# Patient Record
Sex: Female | Born: 1970 | Race: White | Hispanic: No | Marital: Married | Smoking: Never smoker
Health system: Southern US, Community
[De-identification: ages and names within clinical notes are randomized; demographics above are authoritative.]

## PROBLEM LIST (undated history)

## (undated) DIAGNOSIS — M419 Scoliosis, unspecified: Secondary | ICD-10-CM

## (undated) DIAGNOSIS — G909 Disorder of the autonomic nervous system, unspecified: Secondary | ICD-10-CM

## (undated) HISTORY — PX: BREAST LUMPECTOMY: SHX2

## (undated) HISTORY — DX: Scoliosis, unspecified: M41.9

## (undated) HISTORY — PX: ABDOMINAL HYSTERECTOMY: SHX81

## (undated) HISTORY — DX: Disorder of the autonomic nervous system, unspecified: G90.9

## (undated) HISTORY — PX: SPINAL FUSION: SHX223

## (undated) HISTORY — PX: TONSILLECTOMY: SHX5217

## (undated) HISTORY — PX: HERNIA REPAIR: SHX51

---

## 2001-05-03 ENCOUNTER — Encounter: Payer: Self-pay | Admitting: Family Medicine

## 2001-05-03 ENCOUNTER — Encounter: Admission: RE | Admit: 2001-05-03 | Discharge: 2001-05-03 | Payer: Self-pay | Admitting: Family Medicine

## 2001-06-05 ENCOUNTER — Ambulatory Visit (HOSPITAL_COMMUNITY): Admission: RE | Admit: 2001-06-05 | Discharge: 2001-06-05 | Payer: Self-pay | Admitting: Surgery

## 2001-06-05 ENCOUNTER — Encounter (INDEPENDENT_AMBULATORY_CARE_PROVIDER_SITE_OTHER): Payer: Self-pay | Admitting: *Deleted

## 2001-07-15 ENCOUNTER — Encounter: Admission: RE | Admit: 2001-07-15 | Discharge: 2001-07-15 | Payer: Self-pay | Admitting: Family Medicine

## 2001-07-15 ENCOUNTER — Encounter: Payer: Self-pay | Admitting: Family Medicine

## 2005-10-10 ENCOUNTER — Encounter: Admission: RE | Admit: 2005-10-10 | Discharge: 2005-10-10 | Payer: Self-pay | Admitting: Family Medicine

## 2006-04-17 ENCOUNTER — Other Ambulatory Visit: Admission: RE | Admit: 2006-04-17 | Discharge: 2006-04-17 | Payer: Self-pay | Admitting: Family Medicine

## 2008-10-01 ENCOUNTER — Other Ambulatory Visit: Admission: RE | Admit: 2008-10-01 | Discharge: 2008-10-01 | Payer: Self-pay | Admitting: Gynecology

## 2008-10-01 ENCOUNTER — Ambulatory Visit: Payer: Self-pay | Admitting: Gynecology

## 2008-10-01 ENCOUNTER — Encounter: Payer: Self-pay | Admitting: Gynecology

## 2008-10-07 ENCOUNTER — Ambulatory Visit: Payer: Self-pay | Admitting: Gynecology

## 2008-12-02 ENCOUNTER — Ambulatory Visit: Payer: Self-pay | Admitting: Gynecology

## 2008-12-10 ENCOUNTER — Ambulatory Visit: Payer: Self-pay | Admitting: Gynecology

## 2008-12-10 ENCOUNTER — Ambulatory Visit (HOSPITAL_BASED_OUTPATIENT_CLINIC_OR_DEPARTMENT_OTHER): Admission: RE | Admit: 2008-12-10 | Discharge: 2008-12-11 | Payer: Self-pay | Admitting: Gynecology

## 2008-12-10 ENCOUNTER — Encounter: Payer: Self-pay | Admitting: Gynecology

## 2008-12-21 ENCOUNTER — Ambulatory Visit: Payer: Self-pay | Admitting: Gynecology

## 2009-01-06 ENCOUNTER — Ambulatory Visit: Payer: Self-pay | Admitting: Gynecology

## 2010-12-30 ENCOUNTER — Other Ambulatory Visit (HOSPITAL_COMMUNITY)
Admission: RE | Admit: 2010-12-30 | Discharge: 2010-12-30 | Disposition: A | Discharge status: Home / Self Care | Payer: BC Managed Care – PPO | Source: Ambulatory Visit | Attending: Gynecology | Admitting: Gynecology

## 2010-12-30 ENCOUNTER — Other Ambulatory Visit: Payer: Self-pay | Admitting: Gynecology

## 2010-12-30 ENCOUNTER — Encounter (INDEPENDENT_AMBULATORY_CARE_PROVIDER_SITE_OTHER): Payer: BC Managed Care – PPO | Admitting: Gynecology

## 2010-12-30 DIAGNOSIS — Z833 Family history of diabetes mellitus: Secondary | ICD-10-CM

## 2010-12-30 DIAGNOSIS — N951 Menopausal and female climacteric states: Secondary | ICD-10-CM

## 2010-12-30 DIAGNOSIS — R823 Hemoglobinuria: Secondary | ICD-10-CM

## 2010-12-30 DIAGNOSIS — Z124 Encounter for screening for malignant neoplasm of cervix: Secondary | ICD-10-CM | POA: Insufficient documentation

## 2010-12-30 DIAGNOSIS — Z13220 Encounter for screening for lipoid disorders: Secondary | ICD-10-CM

## 2010-12-30 DIAGNOSIS — Z01419 Encounter for gynecological examination (general) (routine) without abnormal findings: Secondary | ICD-10-CM

## 2011-03-06 LAB — DIFFERENTIAL
Basophils Absolute: 0 10*3/uL (ref 0.0–0.1)
Basophils Relative: 0 % (ref 0–1)
Eosinophils Absolute: 0 10*3/uL (ref 0.0–0.7)
Eosinophils Relative: 0 % (ref 0–5)
Lymphocytes Relative: 13 % (ref 12–46)
Lymphs Abs: 1.5 10*3/uL (ref 0.7–4.0)
Monocytes Absolute: 0.8 10*3/uL (ref 0.1–1.0)
Monocytes Relative: 7 % (ref 3–12)
Neutro Abs: 9.3 10*3/uL — ABNORMAL HIGH (ref 1.7–7.7)
Neutrophils Relative %: 80 % — ABNORMAL HIGH (ref 43–77)

## 2011-03-06 LAB — CBC
HCT: 32.6 % — ABNORMAL LOW (ref 36.0–46.0)
Hemoglobin: 11.2 g/dL — ABNORMAL LOW (ref 12.0–15.0)
MCHC: 34.3 g/dL (ref 30.0–36.0)
MCV: 97.4 fL (ref 78.0–100.0)
Platelets: 190 10*3/uL (ref 150–400)
RBC: 3.35 MIL/uL — ABNORMAL LOW (ref 3.87–5.11)
RDW: 12.2 % (ref 11.5–15.5)
WBC: 11.6 10*3/uL — ABNORMAL HIGH (ref 4.0–10.5)

## 2011-04-04 NOTE — Discharge Summary (Signed)
[name], [name]          ACCOUNT NO.:  [id number]   MEDICAL RECORD NO.:  [id number]          PATIENT TYPE:  AMB   LOCATION:  [facility]                         FACILITY:  [facility]   PHYSICIAN:  Timothy P. Fontaine, M.D.DATE OF BIRTH:  1971/04/09   DATE OF ADMISSION:  12/10/2008  DATE OF DISCHARGE:  12/11/2008                               DISCHARGE SUMMARY   DISCHARGE DIAGNOSES:  1. Menorrhagia.  2. Dysmenorrhea.  3. Endometriosis.   PROCEDURE:  Laparoscopic-assisted vaginal hysterectomy, fulguration  endometriosis on December 10, 2008.  Surgeon, Fontaine.  Pathology  pending.   HOSPITAL COURSE:  A 40 year old underwent uncomplicated LAVH with  findings of posterior cul-de-sac endometriosis.  All visible implants  were fulgurated.  The patient's postoperative course was uncomplicated.  She was discharged on postoperative day #1, ambulating well, tolerating  a regular diet, voiding without difficulty with a postoperative  hemoglobin of 11.  The patient received precautions, instructions, and  followup, will be seen in the office in 2 weeks, and received a  prescription for Tylox #25 one to two p.o. every 4 to 6 hours p.r.n.  pain.      Timothy P. Fontaine, M.D.  Electronically Signed     TPF/MEDQ  D:  12/11/2008  T:  12/11/2008  Job:  [id number]

## 2011-04-04 NOTE — H&P (Signed)
[name], [name]          ACCOUNT NO.:  [id number]   MEDICAL RECORD NO.:  [id number]          PATIENT TYPE:  AMB   LOCATION:  [facility]                         FACILITY:  [facility]   PHYSICIAN:  Timothy P. Fontaine, M.D.DATE OF BIRTH:  Sep 03, 1971   DATE OF ADMISSION:  12/10/2008  DATE OF DISCHARGE:                              HISTORY & PHYSICAL   CHIEF COMPLAINT:  Increasing dysmenorrhea and menorrhagia.   HISTORY OF PRESENT ILLNESS:  A 40 year old G1, P61 female, vasectomy  birth control, presents with worsening menorrhagia and dysmenorrhea.  Patient had been on trial of oral contraceptives but could not tolerate  these and ultimately discontinued them.  Outpatient evaluation included  an ultrasound sonohystogram which was suggestive of adenomyosis, no  intracavitary abnormalities, and endometrial biopsy which showed benign  secretory endometrium.  Options for management were reviewed with the  patient to include omental manipulation such as retrial of birth control  pills, more aggressive progesterone such as Depo-Provera or Depo-Lupron,  Mirena IUD, endometrial ablation, hysterectomy.  Patient wants to  proceed with hysterectomy and is admitted for LAVH.   PAST MEDICAL HISTORY:  Uncomplicated.   PAST SURGICAL HISTORY:  Includes:  1. A hernia repair in the groin as a child.  2. Tonsillectomy.  3. Vaginal birth.   ALLERGIES:  NO MEDICATIONS.   CURRENT MEDICATIONS:  None.   REVIEW OF SYSTEMS:  Noncontributory.   FAMILY HISTORY:  Noncontributory.   SOCIAL HISTORY:  Noncontributory.   ADMISSION PHYSICAL EXAM:  Afebrile.  VITAL SIGNS:  Stable.  HEENT: Normal.  LUNGS:  Clear.  CARDIAC:  Regular rate.  No rubs, murmurs, or gallops.  ABDOMINAL EXAM:  Benign.  PELVIC:  External BUS, vagina normal.  Cervix normal.  Uterus normal  size, midline mobile, nontender.  Adnexa without masses or tenderness.   ASSESSMENT:  A 40 year old G1, P43 female, vasectomy birth control,  worsening dysmenorrhea and menorrhagia that is becoming life altering.  Outpatient evaluation ultrasound suggestive of adenomyosis.  Endometrial  biopsy negative.  Counseled for options to include conservative choices  and elects for hysterectomy.  Given the history of dysmenorrhea and  suggestive of adenomyosis, we will plan laparoscopic-assisted vaginal  hysterectomy to allow for pelvic assessment to rule out overt  endometriosis.  I reviewed the issues with hysterectomy with her.  We  discussed sexuality following hysterectomy.  The risk of persistent  orgasmic dysfunction and the risk of persistent dyspareunia following  the surgery which she understands and accepts.  The absolute  irreversible sterility associated with hysterectomy was also reviewed,  understood, and accepted.  The ovarian conservation issue was discussed  with her.  The options for removing both ovaries and the resultant  hypoestrogenic state leading to symptoms, possible accelerated  cardiovascular risk, and bone health risk was discussed versus keeping  both ovaries and the risk of issues in the future both from benign  ovarian disease such as cysts and pain, as well as the risk of ovarian  cancer in the future was all discussed, understood, and accepted.  The  patient desires to keep both ovaries but does give me permission to  remove one or  both ovaries if significant disease encountered at the  time of surgery.  The expected intraoperative and postoperative courses  were reviewed with her, instrumentation, trocar placement, multiple port  sites, insufflation, use of the laparoscope, sharp blunt dissection,  electrocautery, harmonic scalpel, all reviewed.  The risks to include  infection requiring prolonged antibiotics, abscess formation or hematoma  formation requiring reoperation, abscess, hematoma, drainage, incisional  complications requiring opening and draining of incisions, long-term  issues, cosmetics,  hernia, all reviewed.  She understands that at any  time during the procedure I may convert from a laparoscopic to an  abdominal exploratory laparotomy to complete the procedure and she  understands and accepts this.  The risks of bleeding leading to  hemorrhage necessitating transfusion and the risks of transfusion  discussed to include transfusion reaction, hepatitis, HIV, mad cow  disease, and other unknown entities.  The risk of inadvertent injury to  internal organs including bowel, bladder, ureters, vessels, and nerves  necessitating major exploratory reparative surgeries, future reparative  surgeries including bowel resection, ostomy formation, bladder repair,  ureteral damage repair, was all discussed, understood, and accepted.  Patient understands there are no guarantees as far as long-term relief  from pelvic pain and that she may have persistent pain or develop new  pain following the procedure and she understands and accepts this.  The  patient's questions were answered to her satisfaction.  Her husband was  present during the counseling session and neither had questions.      Timothy P. Fontaine, M.D.  Electronically Signed     TPF/MEDQ  D:  12/02/2008  T:  12/02/2008  Job:  [id number]

## 2011-04-04 NOTE — Op Note (Signed)
NAMEJACQUELINA, [name]          ACCOUNT NO.:  [id number]   MEDICAL RECORD NO.:  [id number]          PATIENT TYPE:  AMB   LOCATION:  [facility]                         FACILITY:  [facility]   PHYSICIAN:  Timothy P. Fontaine, M.D.DATE OF BIRTH:  03-30-1971   DATE OF PROCEDURE:  12/10/2008  DATE OF DISCHARGE:                               OPERATIVE REPORT   PREOPERATIVE DIAGNOSES:  Dysmenorrhea, menorrhagia.   POSTOPERATIVE DIAGNOSES:  Dysmenorrhea, menorrhagia, endometriosis.   PROCEDURE:  Laparoscopic assisted vaginal hysterectomy, fulguration  endometriosis.   SURGEON:  Timothy P. Fontaine, M.D.   ASSISTANT:  Rande Brunt. Eda Paschal, M.D.   ANESTHETIC:  General.   COMPLICATIONS:  None.   ESTIMATED BLOOD LOSS:  200 mL.   SPECIMEN:  Uterus to pathology.   FINDINGS:  EUA:  External BUS vagina normal.  Cervix normal. Uterus  normal size, midline, mobile.  Adnexa without masses.  Laparoscopic  anterior cul-de-sac normal.  Posterior cul-de-sac with several classic  endometriotic powder burn implants.  Mid cul-de-sac and upper right  uterosacral ligament at the insertion with the uterus.  Uterus normal  size, shape and contour.  Right and left fallopian tubes normal length  caliber fimbriated ends.  Right and left ovaries grossly normal free and  mobile.  No other evidence of endometriosis or pelvic adhesions.  Upper  abdominal exam:  Appendix grossly normal. Liver smooth to limited  inspection, grossly normal.  Gallbladder not visualized.   PROCEDURE:  The patient was taken the operating room, underwent general  anesthesia, was placed in low dorsal lithotomy position, received an  abdominal perineal vaginal preparation with Betadine solution.  Bladder  emptied with indwelling Foley catheterization.  EUA performed a Hulka  tenaculum placed on the cervix.  The patient was draped in usual  fashion.  A transverse infraumbilical incision was made.  The 10-mm  OptiVu direct entry trocar was  then placed under direct visualization  without difficulty and the abdomen was subsequently insufflated.  Right  and left 5-mm suprapubic ports were then placed.  Examination of pelvic  organs, upper abdominal exam was carried out with findings noted above.  The left uterine ovarian pedicle and fallopian tube were identified  using bipolar cautery, were cauterized to a flow of zero and sharply  incised.  The parametrial broad ligament was cauterized and incised and  the round ligament was likewise cauterized to a flow of zero and  incised.  The anterior vesicouterine peritoneal plane was then sharply  incised to the midline.  A similar procedure was carried out on the  other side meeting at the midline peritoneal reflection.  Several areas  of posterior cul-de-sac endometriosis were then superficially bipolar  cauterized.  Attention was then turned to the vaginal portion of the  procedure and the patient placed in the high dorsal lithotomy position,  Hulka tenaculum removed.  The cervix visualized with weighted speculum  and retractors, grasped with a tenaculum and the cervical mucosa was  circumferentially injected using 1% lidocaine with epinephrine mixture,  a total of 8 mL.  The cervical mucosa was then circumferentially incised  and paracervical plane sharply developed.  The posterior  cul-de-sac was  then sharply entered and a long weighted speculum was placed.  The right  and left uterosacral ligaments were identified, clamped, cut and ligated  using zero Vicryl suture and tagged for future reference.  The anterior  cul-de-sac was then sharply and bluntly developed and ultimately entered  without difficulty and the uterus was progressively freed from its  attachments through clamping, cutting and ligating of the cardinal  ligaments and parametrial tissues.  The uterus was ultimately delivered  through the vagina with the remaining attachments clamped, cut and  ligated using zero  Vicryl suture.  The cul-de-sac was irrigated and  subsequently the longer speculum was replaced with a shorter speculum.  The posterior vaginal cuff grasped with Allis clamp and the posterior  vaginal cuff was run from uterosacral ligament to uterosacral ligament  using zero Vicryl suture in a running interlocking stitch.  Hemostasis  was visualized grossly.  The vagina was then closed anterior to  posterior using zero Vicryl suture in interrupted figure-of-eight  stitch.  The vagina was irrigated.  Hemostasis visualized.  The patient  placed in the low dorsal lithotomy position.  The abdomen reinsufflated,  the surgical team regloved and the pelvis was reinspected under direct  visualization.  Several bleeding points were addressed using bipolar  cautery.  The pelvis was copiously irrigated.  Hemostasis visualized.  The gas slowly allowed to escaped.  Hemostasis again visualized at all  pedicles and along the vaginal cuff under a low pressure situation.  The  right and left 5-mm suprapubic ports were then removed and again  hemostasis visualized.  The infraumbilical port was then backed out  under direct visualization showing adequate hemostasis.  No evidence of  hernia formation.  A zero Vicryl subcutaneous fascial stitch was placed  infraumbilically.  All skin incisions injected using 0.25% Marcaine and  all skin incisions closed using Dermabond skin adhesive.  The patient  was placed in the supine position, awakened without difficulty and taken  to the recovery room in good condition having tolerated procedure well  with approximately 200 mL of clear yellow urine in the Foley catheter.      Timothy P. Fontaine, M.D.  Electronically Signed     TPF/MEDQ  D:  12/10/2008  T:  12/10/2008  Job:  [id number]

## 2011-04-07 NOTE — Op Note (Signed)
[facility]  Patient:    [name], [name]                 MRN: [id number] Proc. Date: 06/05/01 Adm. Date:  [id number] Attending:  Charlton Haws CC:         Carola J. Gerri Spore, M.D.  [facility]   Operative Report  ACCOUNT NUMBER:  [id number]  CCS NUMBER:  [id number]  PREOPERATIVE DIAGNOSIS:  Bilateral fibroadenomas.  POSTOPERATIVE DIAGNOSIS:  Bilateral fibroadenomas.  OPERATION:  Excision of bilateral breast masses.  SURGEON:  Currie Paris, M.D.  ANESTHESIA:  MAC.  CLINICAL HISTORY:  This patient is a 40 year old, who recently found a left breast mass which by ultrasound appeared to be a benign fibroadenoma.  On physical, she had a similar-feeling mass in the right breast, clinically a fibroadenoma and office ultrasound was consistent with fibroadenoma.  She was offered alternatives of simple follow-up, core biopsy, or excisional biopsy. The patient felt very strongly that she wished both of these removed completely rather than any form of initial biopsy.  DESCRIPTION OF PROCEDURE:  The patient was seen in the holding area and then taken to the operating room.  Prior to being given any sedation, both masses were identified and marked.  She was then given IV sedation.  Both breasts were prepped and draped as a single large sterile field.  The left breast was temporarily covered.  Xylocaine 1% was used as local and infiltrated over the mass in the right breast.  A skin incision was made, and the subcutaneous tissue was divided.  The mass was palpable fairly readily just under some breast tissue, and I was able to put a 3-0 Vicryl suture through it and use this for traction.  Using cutting current with cautery, the mass was incised in toto and without entering into it.  Bleeders were electrocoagulated, and the incision closed in layers with 3-0 Vicryl followed by 4-0 Monocryl subcuticular plus Steri-Strips.  New  instruments were used for the left-sided lesion which was excised in a similar and closed in a similar fashion.  The patient tolerated the procedure well.  There were no operative complications.  All counts were correct. DD:  06/05/01 TD:  06/05/01 Job: [id number]

## 2011-08-31 ENCOUNTER — Other Ambulatory Visit: Payer: Self-pay | Admitting: *Deleted

## 2011-08-31 ENCOUNTER — Other Ambulatory Visit: Payer: Self-pay | Admitting: Gynecology

## 2011-08-31 DIAGNOSIS — D249 Benign neoplasm of unspecified breast: Secondary | ICD-10-CM

## 2011-09-19 ENCOUNTER — Ambulatory Visit
Admission: RE | Admit: 2011-09-19 | Discharge: 2011-09-19 | Disposition: A | Discharge status: Home / Self Care | Payer: BC Managed Care – PPO | Source: Ambulatory Visit | Attending: Gynecology | Admitting: Gynecology

## 2011-09-19 ENCOUNTER — Other Ambulatory Visit: Payer: Self-pay | Admitting: Gynecology

## 2011-09-19 DIAGNOSIS — Z1231 Encounter for screening mammogram for malignant neoplasm of breast: Secondary | ICD-10-CM

## 2011-09-19 DIAGNOSIS — D249 Benign neoplasm of unspecified breast: Secondary | ICD-10-CM

## 2011-09-27 ENCOUNTER — Other Ambulatory Visit: Payer: Self-pay | Admitting: Gynecology

## 2011-09-27 DIAGNOSIS — R928 Other abnormal and inconclusive findings on diagnostic imaging of breast: Secondary | ICD-10-CM

## 2011-10-04 ENCOUNTER — Other Ambulatory Visit: Payer: Self-pay | Admitting: *Deleted

## 2011-10-04 DIAGNOSIS — N63 Unspecified lump in unspecified breast: Secondary | ICD-10-CM

## 2011-10-13 ENCOUNTER — Ambulatory Visit
Admission: RE | Admit: 2011-10-13 | Discharge: 2011-10-13 | Disposition: A | Discharge status: Home / Self Care | Payer: BC Managed Care – PPO | Source: Ambulatory Visit | Attending: Gynecology | Admitting: Gynecology

## 2011-10-13 DIAGNOSIS — R928 Other abnormal and inconclusive findings on diagnostic imaging of breast: Secondary | ICD-10-CM

## 2012-02-06 ENCOUNTER — Ambulatory Visit (INDEPENDENT_AMBULATORY_CARE_PROVIDER_SITE_OTHER): Payer: BC Managed Care – PPO | Admitting: Family Medicine

## 2012-02-06 ENCOUNTER — Ambulatory Visit: Payer: BC Managed Care – PPO

## 2012-02-06 VITALS — BP 131/86 | HR 123 | Temp 99.7°F | Resp 16 | Ht 65.5 in | Wt 130.0 lb

## 2012-02-06 DIAGNOSIS — R059 Cough, unspecified: Secondary | ICD-10-CM

## 2012-02-06 DIAGNOSIS — R05 Cough: Secondary | ICD-10-CM

## 2012-02-06 DIAGNOSIS — M419 Scoliosis, unspecified: Secondary | ICD-10-CM

## 2012-02-06 DIAGNOSIS — J189 Pneumonia, unspecified organism: Secondary | ICD-10-CM

## 2012-02-06 DIAGNOSIS — M4120 Other idiopathic scoliosis, site unspecified: Secondary | ICD-10-CM

## 2012-02-06 LAB — POCT CBC
Granulocyte percent: 77.2 %G (ref 37–80)
HCT, POC: 43.5 % (ref 37.7–47.9)
Hemoglobin: 14.3 g/dL (ref 12.2–16.2)
Lymph, poc: 1.5 (ref 0.6–3.4)
MCH, POC: 31.5 pg — AB (ref 27–31.2)
MCHC: 32.9 g/dL (ref 31.8–35.4)
MCV: 95.8 fL (ref 80–97)
MID (cbc): 0.5 (ref 0–0.9)
MPV: 9.1 fL (ref 0–99.8)
POC Granulocyte: 6.7 (ref 2–6.9)
POC LYMPH PERCENT: 16.9 %L (ref 10–50)
POC MID %: 5.9 %M (ref 0–12)
Platelet Count, POC: 212 10*3/uL (ref 142–424)
RBC: 4.54 M/uL (ref 4.04–5.48)
RDW, POC: 12.9 %
WBC: 8.7 10*3/uL (ref 4.6–10.2)

## 2012-02-06 MED ORDER — MOXIFLOXACIN HCL 400 MG PO TABS: 400.0000 mg | ORAL_TABLET | Freq: Every day | ORAL | Status: AC

## 2012-02-06 MED ORDER — HYDROCODONE-HOMATROPINE 5-1.5 MG/5ML PO SYRP: 5.0000 mL | ORAL_SOLUTION | Freq: Three times a day (TID) | ORAL | Status: AC | PRN

## 2012-02-06 NOTE — Progress Notes (Signed)
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  She began with URI sx 1 week ago, but 48 hours ago she developed fever to 103 degrees with cough and congestion.  Notes back pain.  Some vomiting because cough is so severe.  Has lost her appetite for 72 hours.  O:  No resp distress TM's retracted Oroph: mild erythema Neck supple with mild ant cerv adenop Chest:  Marked scoliosis, no rales Heart: tachycardic with S4 gallop, no murmur Skin:  No rash, icterus  Results for orders placed in visit on 02/06/12  POCT CBC      Component Value Range   WBC 8.7  4.6 - 10.2 (K/uL)   Lymph, poc 1.5  0.6 - 3.4    POC LYMPH PERCENT 16.9  10 - 50 (%L)   MID (cbc) 0.5  0 - 0.9    POC MID % 5.9  0 - 12 (%M)   POC Granulocyte 6.7  2 - 6.9    Granulocyte percent 77.2  37 - 80 (%G)   RBC 4.54  4.04 - 5.48 (M/uL)   Hemoglobin 14.3  12.2 - 16.2 (g/dL)   HCT, POC 96.0  45.4 - 47.9 (%)   MCV 95.8  80 - 97 (fL)   MCH, POC 31.5 (*) 27 - 31.2 (pg)   MCHC 32.9  31.8 - 35.4 (g/dL)   RDW, POC 09.8     Platelet Count, POC 212  142 - 424 (K/uL)   MPV 9.1  0 - 99.8 (fL)   [facility] reading (PRIMARY) by  Dr.Riddhi Grether CXR - scoliosis, RLL infiltrate  A: RLL pneumonia  P:  Avelox hydromet Recheck Friday

## 2012-02-06 NOTE — Patient Instructions (Signed)

## 2012-02-09 ENCOUNTER — Ambulatory Visit (INDEPENDENT_AMBULATORY_CARE_PROVIDER_SITE_OTHER): Payer: BC Managed Care – PPO | Admitting: Family Medicine

## 2012-02-09 VITALS — BP 125/80 | HR 102 | Temp 98.8°F | Resp 16 | Ht 65.0 in | Wt 128.0 lb

## 2012-02-09 DIAGNOSIS — B308 Other viral conjunctivitis: Secondary | ICD-10-CM

## 2012-02-09 DIAGNOSIS — H109 Unspecified conjunctivitis: Secondary | ICD-10-CM

## 2012-02-09 DIAGNOSIS — J129 Viral pneumonia, unspecified: Secondary | ICD-10-CM

## 2012-02-09 DIAGNOSIS — J189 Pneumonia, unspecified organism: Secondary | ICD-10-CM

## 2012-02-09 MED ORDER — TOBRAMYCIN 0.3 % OP SOLN: 1.0000 [drp] | Freq: Four times a day (QID) | OPHTHALMIC | Status: AC

## 2012-02-09 NOTE — Progress Notes (Signed)
41 year old woman coming in today for followup on pneumonia She had sweats 3 yesterday afternoon but they stopped. Overall she feels better but she still having congested cough and feels weak. She started eating again and.  Objective: Rales continue in the right lower lobe  HEENT clear with the exception of some sinus congestion  Eyes: Mild injection left greater than right  Skin warm and dry  Patient appears much better than she did the other day.  Assessment: Improving pneumonia, new conjunctivitis  Plan continue the Avelox and add Tobrex.

## 2012-10-08 ENCOUNTER — Other Ambulatory Visit: Payer: Self-pay | Admitting: Family Medicine

## 2012-10-08 DIAGNOSIS — Z1231 Encounter for screening mammogram for malignant neoplasm of breast: Secondary | ICD-10-CM

## 2012-10-23 ENCOUNTER — Ambulatory Visit
Admission: RE | Admit: 2012-10-23 | Discharge: 2012-10-23 | Disposition: A | Discharge status: Home / Self Care | Payer: BC Managed Care – PPO | Source: Ambulatory Visit | Attending: Family Medicine | Admitting: Family Medicine

## 2012-10-23 DIAGNOSIS — Z1231 Encounter for screening mammogram for malignant neoplasm of breast: Secondary | ICD-10-CM

## 2012-10-30 ENCOUNTER — Other Ambulatory Visit: Payer: Self-pay | Admitting: Family Medicine

## 2012-10-30 DIAGNOSIS — R928 Other abnormal and inconclusive findings on diagnostic imaging of breast: Secondary | ICD-10-CM

## 2012-11-08 ENCOUNTER — Ambulatory Visit
Admission: RE | Admit: 2012-11-08 | Discharge: 2012-11-08 | Disposition: A | Discharge status: Home / Self Care | Payer: BC Managed Care – PPO | Source: Ambulatory Visit | Attending: Family Medicine | Admitting: Family Medicine

## 2012-11-08 ENCOUNTER — Other Ambulatory Visit: Payer: BC Managed Care – PPO

## 2012-11-08 DIAGNOSIS — R928 Other abnormal and inconclusive findings on diagnostic imaging of breast: Secondary | ICD-10-CM

## 2013-10-09 ENCOUNTER — Other Ambulatory Visit: Payer: Self-pay

## 2013-10-09 DIAGNOSIS — Z1231 Encounter for screening mammogram for malignant neoplasm of breast: Secondary | ICD-10-CM

## 2013-11-07 ENCOUNTER — Ambulatory Visit
Admission: RE | Admit: 2013-11-07 | Discharge: 2013-11-07 | Disposition: A | Discharge status: Home / Self Care | Payer: BC Managed Care – PPO | Source: Ambulatory Visit

## 2013-11-07 DIAGNOSIS — Z1231 Encounter for screening mammogram for malignant neoplasm of breast: Secondary | ICD-10-CM

## 2014-03-09 ENCOUNTER — Emergency Department (HOSPITAL_COMMUNITY)
Admission: EM | Admit: 2014-03-09 | Discharge: 2014-03-09 | Disposition: A | Discharge status: Home / Self Care | Payer: BC Managed Care – PPO | Attending: Emergency Medicine | Admitting: Emergency Medicine

## 2014-03-09 ENCOUNTER — Emergency Department (HOSPITAL_COMMUNITY): Payer: BC Managed Care – PPO

## 2014-03-09 ENCOUNTER — Encounter (HOSPITAL_COMMUNITY): Payer: Self-pay | Admitting: Emergency Medicine

## 2014-03-09 DIAGNOSIS — Y939 Activity, unspecified: Secondary | ICD-10-CM | POA: Insufficient documentation

## 2014-03-09 DIAGNOSIS — S92345A Nondisplaced fracture of fourth metatarsal bone, left foot, initial encounter for closed fracture: Secondary | ICD-10-CM

## 2014-03-09 DIAGNOSIS — S92309A Fracture of unspecified metatarsal bone(s), unspecified foot, initial encounter for closed fracture: Secondary | ICD-10-CM | POA: Insufficient documentation

## 2014-03-09 DIAGNOSIS — R269 Unspecified abnormalities of gait and mobility: Secondary | ICD-10-CM | POA: Insufficient documentation

## 2014-03-09 DIAGNOSIS — Y929 Unspecified place or not applicable: Secondary | ICD-10-CM | POA: Insufficient documentation

## 2014-03-09 DIAGNOSIS — W11XXXA Fall on and from ladder, initial encounter: Secondary | ICD-10-CM | POA: Insufficient documentation

## 2014-03-09 DIAGNOSIS — S92335A Nondisplaced fracture of third metatarsal bone, left foot, initial encounter for closed fracture: Secondary | ICD-10-CM

## 2014-03-09 MED ORDER — MELOXICAM 7.5 MG PO TABS: 15.0000 mg | ORAL_TABLET | Freq: Every day | ORAL | Status: DC

## 2014-03-09 MED ORDER — OXYCODONE-ACETAMINOPHEN 5-325 MG PO TABS
1.0000 | ORAL_TABLET | Freq: Once | ORAL | Status: AC
  Administered 2014-03-09: 1 via ORAL
  Filled 2014-03-09: qty 1

## 2014-03-09 MED ORDER — OXYCODONE-ACETAMINOPHEN 5-325 MG PO TABS: 1.0000 | ORAL_TABLET | Freq: Four times a day (QID) | ORAL | Status: DC | PRN

## 2014-03-09 MED ORDER — IBUPROFEN 400 MG PO TABS
800.0000 mg | ORAL_TABLET | Freq: Once | ORAL | Status: AC
Start: 2014-03-09 — End: 2014-03-09
  Administered 2014-03-09: 800 mg via ORAL
  Filled 2014-03-09: qty 2

## 2014-03-09 NOTE — Discharge Instructions (Signed)
Cast or Splint Care Casts and splints support injured limbs and keep bones from moving while they heal.  HOME CARE  Keep the cast or splint uncovered during the drying period.  A plaster cast can take 24 to 48 hours to dry.  A fiberglass cast will dry in less than 1 hour.  Do not rest the cast on anything harder than a pillow for 24 hours.  Do not put weight on your injured limb. Do not put pressure on the cast. Wait for your doctor's approval.  Keep the cast or splint dry.  Cover the cast or splint with a plastic bag during baths or wet weather.  If you have a cast over your chest and belly (trunk), take sponge baths until the cast is taken off.  If your cast gets wet, dry it with a towel or blow dryer. Use the cool setting on the blow dryer.  Keep your cast or splint clean. Wash a dirty cast with a damp cloth.  Do not put any objects under your cast or splint.  Do not scratch the skin under the cast with an object. If itching is a problem, use a blow dryer on a cool setting over the itchy area.  Do not trim or cut your cast.  Do not take out the padding from inside your cast.  Exercise your joints near the cast as told by your doctor.  Raise (elevate) your injured limb on 1 or 2 pillows for the first 1 to 3 days. GET HELP IF:  Your cast or splint cracks.  Your cast or splint is too tight or too loose.  You itch badly under the cast.  Your cast gets wet or has a soft spot.  You have a bad smell coming from the cast.  You get an object stuck under the cast.  Your skin around the cast becomes red or sore.  You have new or more pain after the cast is put on. GET HELP RIGHT AWAY IF:  You have fluid leaking through the cast.  You cannot move your fingers or toes.  Your fingers or toes turn blue or white or are cool, painful, or puffy (swollen).  You have tingling or lose feeling (numbness) around the injured area.  You have bad pain or pressure under the  cast.  You have trouble breathing or have shortness of breath.  You have chest pain. Document Released: 03/08/2011 Document Revised: 07/09/2013 Document Reviewed: 05/15/2013 [facility] Patient Information 2014 [place].  Metatarsal Fracture  with Rehab A metatarsal fracture is a break (fracture) of one of the bones of the mid-foot (metatarsal bones). The metatarsal bones are responsible for maintaining the arch of the foot. There are three classifications of metatarsal fractures: dancer's fractures, Jones fractures, and stress fractures. A dancer's fracture is when a piece of bone is pulled off by a ligament or tendon (avulsion fracture) of the outer part of the foot (fifth metatarsal), near the joint with the ankle bones. A Jones fracture occurs in the middle of the fifth metatarsal. These fractures have limited ability to heal. A stress fracture occurs when the bone is slowly injured faster than it can repair itself. SYMPTOMS   Sharp pain, especially with standing or walking.  Tenderness, swelling, and later bruising (contusion) of the foot.  Numbness or paralysis from swelling in the foot, causing pressure on the blood vessels or nerves (uncommon). CAUSES  Fractures occur when a force is placed on the bone that is greater  than it can handle. Common causes of injury include:  Direct hit (trauma) to the foot.  Twisting injury to the foot or ankle.  Landing on the foot and ankle in an improper position. RISK INCRESES WITH:  Participation in contact sports, sports that require jumping and landing, or sports in which cleats are worn and sliding occurs.  Previous foot or ankle sprains or dislocations.  Repeated injury to any joint in the foot.  Poor strength and flexibility. PREVENTION  Warm up and stretch properly before an activity.  Allow for adequate recovery between workouts.  Maintain physical fitness in:  Strength, flexibility, and endurance.  Cardiovascular  fitness.  When participating in jumping or contact sports, protect joints with supportive devices, such as wrapped elastic bandages, tape, braces, or high-top athletic shoes.  Wear properly fitted and padded protective equipment. PROGNOSIS If treated properly, metatarsal fractures usually heal well. Jones fractures have a higher risk of the bone failing to heal (nonunion). Sometimes, surgery is needed to heal Jones fractures.  RELATED COMPLICATIONS   Nonunion.  Fracture heals in a poor position (malunion).  Long-term (chronic) pain, stiffness, or swelling of the foot.  Excessive bleeding in the foot or at the dislocation site, causing pressure and injury to nerves and blood vessels (rare).  Unstable or arthritic joint following repeated injury or delayed treatment. TREATMENT  Treatment first involves the use of ice and medicine, to reduce pain and inflammation. If the bone fragments are out of alignment (displaced), then immediate realigning of the bones (reduction) is required. Fractures that cannot be realigned by hand, or where the bones protrude through the skin (open), may require surgery to hold the fracture in place with screws, pins, and plates. After the bones are in proper alignment, the foot and ankle must be restrained for 6 or more weeks. Restraint allows healing to occur. After restraint, it is important to perform strengthening and stretching exercises to help regain strength and a full range of motion. These exercises may be completed at home or with a therapist. A stiff-soled shoe and arch support (orthotic) may be required when first returning to sports. MEDICATION   If pain medicine is needed, nonsteroidal anti-inflammatory medicines (NSAIDS), or other minor pain relievers, are often advised.  Do not take pain medicine for 7 days before surgery.  Only take over-the-counter or prescription medicines for pain, fever, or discomfort as directed by your caregiver. COLD  THERAPY  Cold treatment (icing) should be applied for 10 to 15 minutes every 2 to 3 hours for inflammations and pain, and immediately after activity that aggravates your symptoms. Use ice packs or ice massage. SEEK MEDICAL CARE IF:  Pain, tenderness, or swelling gets worse, despite treatment.  You experience pain, numbness, or coldness in the foot.  Blue, gray, or dark color appears in the toenails.  You or your child has an oral temperature above 102 F (38.9 C).  You have increased pain, swelling, and redness.  You have drainage of fluids or bleeding in the affected area.  New, unexplained symptoms develop. (Drugs used in treatment may produce side effects.) EXERCISES RANGE OF MOTION (ROM) AND STRETCHING EXERCISES - Metatarsal Fracture (including Jones and Dancer's Fractures) These exercises may help you when beginning to rehabilitate your injury. Your symptoms may resolve with or without further involvement from your physician, physical therapist, or athletic trainer. While completing these exercises, remember:   Restoring tissue flexibility helps normal motion to return to the joints. This allows healthier, less painful movement and  activity.  An effective stretch should be held for at least 30 seconds. A stretch should never be painful. You should only feel a gentle lengthening or release in the stretched. RANGE OF MOTION - Dorsi/Plantar Flexion  While sitting with your right / left knee straight, draw the top of your foot upwards, by flexing your ankle. Then reverse the motion, pointing your toes downward.  Hold each position for __________ seconds.  After completing your first set of exercises, repeat this exercise with your knee bent. Repeat __________ times. Complete this exercise __________ times per day.  RANGE OF MOTION - Ankle Alphabet  Imagine your right / left big toe is a pen.  Keeping your hip and knee still, write out the entire alphabet with your "pen." Make  the letters as large as you can, without increasing any discomfort. Repeat __________ times. Complete this exercise __________ times per day.  STRETCH  Gastroc, Standing  Place your hands on a wall.  Extend your right / left leg behind you, keeping the front knee somewhat bent.  Slightly point your toes inward on your back foot.  Keeping your right / left heel on the floor and your knee straight, shift your weight toward the wall, not allowing your back to arch.  You should feel a gentle stretch in the right / left calf. Hold this position for __________ seconds. Repeat __________ times. Complete this stretch __________ times per day. STRETCH  Soleus, Standing   Place your hands on a wall.  Extend your right / left leg behind you, keeping the other knee somewhat bent.  Slightly point your toes inward on your back foot.  Keep your right / left heel on the floor, bend your back knee, and slightly shift your weight over the back leg so that you feel a gentle stretch deep in your back calf.  Hold this position for __________ seconds. Repeat __________ times. Complete this stretch __________ times per day. STRENGTHENING EXERCISES - Metatarsal Fracture (Including Jones and Dancer's Fractures) These exercises may help you when beginning to rehabilitate your injury. They may resolve your symptoms with or without further involvement from your physician, physical therapist, or athletic trainer. While completing these exercises, remember:   Muscles can gain both the endurance and the strength needed for everyday activities through controlled exercises.  Complete these exercises as instructed by your physician, physical therapist or athletic trainer. Increase the resistance and repetitions only as guided by your caregiver. STRENGTH - Dorsiflexors  Secure a rubber exercise band or tubing to a fixed object (table, pole) and loop the other end around your right / left foot.  Sit on the floor  facing the fixed object. The band should be slightly tense when your foot is relaxed.  Slowly draw your foot back toward you, using your ankle and toes.  Hold this position for __________ seconds. Slowly release the tension in the band and return your foot to the starting position. Repeat __________ times. Complete this exercise __________ times per day.  STRENGTH - Plantar-flexors   Sit with your right / left leg extended. Holding onto both ends of a rubber exercise band or tubing, loop it around the ball of your foot. Keep a slight tension in the band.  Slowly push your toes away from you, pointing them downward.  Hold this position for __________ seconds. Return slowly, controlling the tension in the band. Repeat __________ times. Complete this exercise __________ times per day.  STRENGTH - Plantar-flexors  Stand with your  feet shoulder width apart. Steady yourself with a wall or table, using as little support as needed.  Keeping your weight evenly spread over the width of your feet, rise up on your toes.*  Hold this position for __________ seconds. Repeat __________ times. Complete this exercise __________ times per day.  *If this is too easy, shift your weight toward your right / left leg until you feel challenged. Ultimately, you may be asked to do this exercise while standing on your right / left foot only. STRENGTH - Towel Curls  Sit in a chair, on a non-carpeted surface.  Place your foot on a towel, keeping your heel on the floor.  Pull the towel toward your heel only by curling your toes. Keep your heel on the floor.  If instructed by your physician, physical therapist, or athletic trainer, weight may be added at the end of the towel. Repeat __________ times. Complete this exercise __________ times per day. STRENGTH - Ankle Eversion  Secure one end of a rubber exercise band or tubing to a fixed object (table, pole). Loop the other end around your foot, just before your  toes.  Place your fists between your knees. This will focus your strengthening at your ankle.  Drawing the band across your opposite foot, away from the pole, slowly, pull your little toe out and up. Make sure the band is positioned to resist the entire motion.  Hold this position for __________ seconds.  Have your muscles resist the band, as it slowly pulls your foot back to the starting position. Repeat __________ times. Complete this exercise __________ times per day.  STRENGTH - Ankle Inversion  Secure one end of a rubber exercise band or tubing to a fixed object (table, pole). Loop the other end around your foot, just before your toes.  Place your fists between your knees. This will focus your strengthening at your ankle.  Slowly, pull your big toe up and in, making sure the band is positioned to resist the entire motion.  Hold this position for __________ seconds.  Have your muscles resist the band, as it slowly pulls your foot back to the starting position. Repeat __________ times. Complete this exercises __________ times per day.  Document Released: 11/06/2005 Document Revised: 01/29/2012 Document Reviewed: 02/18/2009 [facility] Patient Information 2014 [place], [place].

## 2014-03-09 NOTE — ED Notes (Addendum)
Pt states she fell off a ladder and hurt her left foot.  Pt states she did not have any dizziness or LOC.  Pt states she took 600 mg of motrin prior to arrival

## 2014-03-09 NOTE — ED Provider Notes (Signed)
CSN: [id number]     Arrival date & time 03/09/14  2016 History  This chart was scribed for non-physician practitioner Noland Fordyce, PA-C working with Osvaldo Shipper, MD by Eston Mould, ED Scribe. This patient was seen in room TR10C/TR10C and the patient's care was started at 10:34 PM .   Chief Complaint  Patient presents with  . Foot Pain   The history is provided by the patient. No language interpreter was used.   HPI Comments: [name] is a 43 y.o. female who presents to the Emergency Department complaining of L foot pain secondary to injury that occurred today. Pt states she was on a ladder and reports falling off the ladder. She states her pain has improved but rates the pain 7/10 and states it is a constant burning sensation. Pain worse with movement and unable to bear weight due to pain. Reports associated bruising and swelling to top of foot.  Pt denies numbness, previous injury to L foot, and other injuries.  History reviewed. No pertinent past medical history. Past Surgical History  Procedure Laterality Date  . Hernia repair    . Abdominal hysterectomy     No family history on file. History  Substance Use Topics  . Smoking status: Never Smoker   . Smokeless tobacco: Not on file  . Alcohol Use: No   OB History   Grav Para Term Preterm Abortions TAB SAB Ect Mult Living                 Review of Systems  Musculoskeletal: Positive for gait problem.  Skin: Negative for wound.  Neurological: Negative for numbness.    Allergies  Review of patient's allergies indicates no known allergies.  Home Medications   Prior to Admission medications   Medication Sig Start Date End Date Taking? Authorizing Provider  traZODone (DESYREL) 100 MG tablet Take 100 mg by mouth at bedtime.   Yes Historical Provider, MD   BP 137/84  Pulse 87  Temp(Src) 99.6 F (37.6 C) (Oral)  Resp 18  Wt 125 lb (56.7 kg)  SpO2 99%  Physical Exam  Nursing note and  vitals reviewed. Constitutional: She is oriented to person, place, and time. She appears well-developed and well-nourished.  HENT:  Head: Normocephalic and atraumatic.  Eyes: EOM are normal.  Neck: Normal range of motion.  Cardiovascular: Normal rate.   Pulmonary/Chest: Effort normal.  Musculoskeletal: Normal range of motion.  Moderate edema to dorsum of L foot with mild ecchymosis. Tenderness to palpation to dorsum of foot. Decreased plantar and dorsum flexion due to pain. Pedal pulses are 2+. Sensation intact. Full ROM of all toes.  Neurological: She is alert and oriented to person, place, and time.  Skin: Skin is warm and dry.  Skin intact with mild ecchymosis.   Psychiatric: She has a normal mood and affect. Her behavior is normal.   ED Course  Procedures  DIAGNOSTIC STUDIES: Oxygen Saturation is 100% on RA, normal by my interpretation.    COORDINATION OF CARE: 10:37 PM-Discussed treatment plan which includes discharge pt with pain medication, post-op shoe and advised pt to F/U with orthopedist. Pain medication offered but pt declined at this time. Pt agreed to plan.   10:48 PM- Consulted with Dr.Xu with [facility] who recommend pt be placed in post-op boot. Advised to call office for F/U.  Labs Review Labs Reviewed - No data to display  Imaging Review Dg Ankle Complete Left  03/09/2014   CLINICAL DATA:  Left  ankle and foot pain, bruising, and swelling after fall with injury.  EXAM: LEFT ANKLE COMPLETE - 3+ VIEW  COMPARISON:  None.  FINDINGS: There is no evidence of fracture, dislocation, or joint effusion. There is no evidence of arthropathy or other focal bone abnormality. Soft tissues are unremarkable.  IMPRESSION: Negative.   Electronically Signed   By: Lucienne Capers M.D.   On: 03/09/2014 22:10   Dg Foot Complete Left  03/09/2014   CLINICAL DATA:  Pain, swelling, and bruising after injury.  EXAM: LEFT FOOT - COMPLETE 3+ VIEW  COMPARISON:  None.  FINDINGS: Dorsal soft  tissue swelling. Mostly transverse fractures of the proximal aspect of the second, third, and fourth metatarsal bones. There is likely extension of fractures to the tarsometatarsal joint surface at least on the third and fourth metatarsals. No significant displacement.  IMPRESSION: Fractures of the proximal aspect of the second third and fourth metatarsal bones with probable extension to the joint spaces. Soft tissue swelling.   Electronically Signed   By: Lucienne Capers M.D.   On: 03/09/2014 22:16     EKG Interpretation None     MDM   Final diagnoses:  Closed nondisplaced fracture of third metatarsal bone of left foot  Closed nondisplaced fracture of fourth metatarsal bone of left foot  Fall from ladder    Pt presenting to ED with left foot pain after fall from ladder. Left foot is neurovascularly in tact. Skin in tact. Plain films: fracture of proximal aspect of 2nd, 3rd and 4th metatarsal bone with probable extension of joint spaces. Consulted with Dr. Erlinda Hong, orthopedics, will place pt in Post-op shoe. Provided crutches, advised to call office to schedule f/u appointment. Return precautions provided. Pt verbalized understanding and agreement with tx plan.   I personally performed the services described in this documentation, which was scribed in my presence. The recorded information has been reviewed and is accurate.    Noland Fordyce, PA-C 03/11/14 [phone]

## 2014-03-09 NOTE — ED Notes (Signed)
Ortho paged. 

## 2014-03-09 NOTE — ED Notes (Signed)
Patient transported to X-ray 

## 2014-03-09 NOTE — ED Notes (Signed)
Pt reports getting "tangled up" coming down a ladder. Fell down 5 steps, twisted left foot, and landed on her bottom. Denies hitting head or LOC. Pt has bruising and swelling to left foot.

## 2014-03-09 NOTE — Progress Notes (Signed)
Orthopedic Tech Progress Note Patient Details:  [name] [birth year] [id number]  Ortho Devices Type of Ortho Device: Postop shoe/boot Ortho Device/Splint Location: lle Ortho Device/Splint Interventions: Application   Yasha Tibbett 03/09/2014, 11:13 PM

## 2014-03-12 NOTE — ED Provider Notes (Signed)
Medical screening examination/treatment/procedure(s) were performed by non-physician practitioner and as supervising physician I was immediately available for consultation/collaboration.   EKG Interpretation None        Osvaldo Shipper, MD 03/12/14 1704

## 2017-07-09 DIAGNOSIS — E785 Hyperlipidemia, unspecified: Secondary | ICD-10-CM | POA: Diagnosis not present

## 2017-07-09 DIAGNOSIS — Z0000 Encounter for general adult medical examination without abnormal findings: Secondary | ICD-10-CM | POA: Diagnosis not present

## 2017-08-24 DIAGNOSIS — Z23 Encounter for immunization: Secondary | ICD-10-CM | POA: Diagnosis not present

## 2018-05-07 DIAGNOSIS — H35413 Lattice degeneration of retina, bilateral: Secondary | ICD-10-CM | POA: Diagnosis not present

## 2018-09-06 DIAGNOSIS — Z23 Encounter for immunization: Secondary | ICD-10-CM | POA: Diagnosis not present

## 2019-03-05 DIAGNOSIS — R300 Dysuria: Secondary | ICD-10-CM | POA: Diagnosis not present

## 2020-09-16 ENCOUNTER — Other Ambulatory Visit: Payer: Self-pay | Admitting: Family Medicine

## 2020-09-16 DIAGNOSIS — Z1231 Encounter for screening mammogram for malignant neoplasm of breast: Secondary | ICD-10-CM

## 2020-09-17 ENCOUNTER — Ambulatory Visit
Admission: RE | Admit: 2020-09-17 | Discharge: 2020-09-17 | Disposition: A | Discharge status: Home / Self Care | Payer: 59 | Source: Ambulatory Visit | Attending: Family Medicine | Admitting: Family Medicine

## 2020-09-17 ENCOUNTER — Other Ambulatory Visit: Payer: Self-pay

## 2020-09-17 DIAGNOSIS — Z1231 Encounter for screening mammogram for malignant neoplasm of breast: Secondary | ICD-10-CM

## 2021-03-25 ENCOUNTER — Encounter (HOSPITAL_COMMUNITY): Payer: Self-pay | Admitting: Emergency Medicine

## 2021-03-25 ENCOUNTER — Emergency Department (HOSPITAL_COMMUNITY): Payer: 59

## 2021-03-25 ENCOUNTER — Emergency Department (HOSPITAL_COMMUNITY)
Admission: EM | Admit: 2021-03-25 | Discharge: 2021-03-26 | Disposition: A | Discharge status: Home / Self Care | Payer: 59 | Attending: Emergency Medicine | Admitting: Emergency Medicine

## 2021-03-25 ENCOUNTER — Other Ambulatory Visit: Payer: Self-pay

## 2021-03-25 DIAGNOSIS — R0602 Shortness of breath: Secondary | ICD-10-CM | POA: Diagnosis present

## 2021-03-25 DIAGNOSIS — J189 Pneumonia, unspecified organism: Secondary | ICD-10-CM | POA: Diagnosis not present

## 2021-03-25 DIAGNOSIS — R109 Unspecified abdominal pain: Secondary | ICD-10-CM | POA: Diagnosis not present

## 2021-03-25 LAB — CBC WITH DIFFERENTIAL/PLATELET
Abs Immature Granulocytes: 0.03 10*3/uL (ref 0.00–0.07)
Basophils Absolute: 0 10*3/uL (ref 0.0–0.1)
Basophils Relative: 0 %
Eosinophils Absolute: 0.1 10*3/uL (ref 0.0–0.5)
Eosinophils Relative: 1 %
HCT: 40.6 % (ref 36.0–46.0)
Hemoglobin: 13.1 g/dL (ref 12.0–15.0)
Immature Granulocytes: 0 %
Lymphocytes Relative: 32 %
Lymphs Abs: 2.2 10*3/uL (ref 0.7–4.0)
MCH: 32 pg (ref 26.0–34.0)
MCHC: 32.3 g/dL (ref 30.0–36.0)
MCV: 99 fL (ref 80.0–100.0)
Monocytes Absolute: 0.4 10*3/uL (ref 0.1–1.0)
Monocytes Relative: 6 %
Neutro Abs: 4 10*3/uL (ref 1.7–7.7)
Neutrophils Relative %: 61 %
Platelets: 338 10*3/uL (ref 150–400)
RBC: 4.1 MIL/uL (ref 3.87–5.11)
RDW: 13.2 % (ref 11.5–15.5)
WBC: 6.7 10*3/uL (ref 4.0–10.5)
nRBC: 0 % (ref 0.0–0.2)

## 2021-03-25 LAB — COMPREHENSIVE METABOLIC PANEL
ALT: 21 U/L (ref 0–44)
AST: 22 U/L (ref 15–41)
Albumin: 4.3 g/dL (ref 3.5–5.0)
Alkaline Phosphatase: 53 U/L (ref 38–126)
Anion gap: 8 (ref 5–15)
BUN: 13 mg/dL (ref 6–20)
CO2: 26 mmol/L (ref 22–32)
Calcium: 9.6 mg/dL (ref 8.9–10.3)
Chloride: 101 mmol/L (ref 98–111)
Creatinine, Ser: 0.68 mg/dL (ref 0.44–1.00)
GFR, Estimated: >60 mL/min (ref >60)
Glucose, Bld: 114 mg/dL — ABNORMAL HIGH (ref 70–99)
Potassium: 3.6 mmol/L (ref 3.5–5.1)
Sodium: 135 mmol/L (ref 135–145)
Total Bilirubin: 0.4 mg/dL (ref 0.3–1.2)
Total Protein: 6.7 g/dL (ref 6.5–8.1)

## 2021-03-25 NOTE — ED Provider Notes (Signed)
Emergency Medicine Provider Triage Evaluation Note  [name] , a 50 y.o. female  was evaluated in triage.  Pt complains of shortness of breath.  The patient endorses progressively worsening, constant shortness of breath since she had back surgery in March 2022.  She reports associated abdominal pain and distention since her hospitalization.  She denies chest pain, worse than at baseline back pain, dizziness, lightheadedness, syncope.  She was seen by her PCP this morning and was advised to come to the ER due to having an elevated D-dimer.  No history of PE or DVT.  Of note, patient mentions that she was having pain in her left thigh earlier this week that has since resolved.  No recent trauma or injury.  No calf pain, leg swelling, vomiting, diarrhea, rash, fever, chills.   Review of Systems  Positive: Shortness of breath, myalgias, abdominal pain, abdominal distention Negative: Leg swelling, vomiting, diarrhea, rash, fever, chills  Physical Exam  BP 119/85 (BP Location: Right Arm)   Pulse (!) 137   Temp 98.4 F (36.9 C) (Oral)   Resp 20   SpO2 99%  Gen:   Awake, Anxious appearing Resp:  Tachypneic, conversational dyspnea, lung sounds are diminished in the bilateral bases.  No adventitious breath sounds. MSK:   Moves extremities without difficulty  Other:    Medical Decision Making  Medically screening exam initiated at 10:30 PM.  Appropriate orders placed.  Laurina Bustle was informed that the remainder of the evaluation will be completed by another provider, this initial triage assessment does not replace that evaluation, and the importance of remaining in the ED until their evaluation is complete.  50 year old female presenting with shortness of breath and left thigh pain accompanied by abdominal pain and distention since having a surgical procedure on her back performed in late March.  She was seen by her PCP this morning and had an elevated D-dimer and was advised  to go to the ER.  Given recent surgical procedure, tachycardia, and shortness of breath, there is significant concern for PE.  Will order CT PE study as well as CT abdomen pelvis for further evaluation of abdominal distention.  Basic labs have also been ordered.  Patient will require evaluation in the emergency department and will be closely monitored until the remainder of her work-up and evaluation can be completed.   Joline Maxcy A, PA-C 03/25/21 2241    Veryl Speak, MD 03/25/21 2256

## 2021-03-25 NOTE — ED Triage Notes (Signed)
Patient informed by her PCP to go to ER due to elevated D- dimer result this evening , seen by her PCP today for SOB and generalized abdominal  pain , no emesis or diarrhea , denies fever or chills .

## 2021-03-26 ENCOUNTER — Emergency Department (HOSPITAL_COMMUNITY): Payer: 59

## 2021-03-26 MED ORDER — ONDANSETRON HCL 4 MG/2ML IJ SOLN
4.0000 mg | Freq: Once | INTRAMUSCULAR | Status: AC
  Administered 2021-03-26: 4 mg via INTRAVENOUS
  Filled 2021-03-26: qty 2

## 2021-03-26 MED ORDER — IOHEXOL 350 MG/ML SOLN
100.0000 mL | Freq: Once | INTRAVENOUS | Status: AC | PRN
  Administered 2021-03-26: 100 mL via INTRAVENOUS

## 2021-03-26 MED ORDER — AZITHROMYCIN 250 MG PO TABS: 250.0000 mg | ORAL_TABLET | Freq: Every day | ORAL | 0 refills | Status: DC

## 2021-03-26 MED ORDER — SODIUM CHLORIDE 0.9 % IV SOLN
2.0000 g | Freq: Once | INTRAVENOUS | Status: AC
  Administered 2021-03-26: 2 g via INTRAVENOUS
  Filled 2021-03-26: qty 20

## 2021-03-26 MED ORDER — MORPHINE SULFATE (PF) 4 MG/ML IV SOLN
4.0000 mg | Freq: Once | INTRAVENOUS | Status: AC
Start: 2021-03-26 — End: 2021-03-26
  Administered 2021-03-26: 4 mg via INTRAVENOUS
  Filled 2021-03-26: qty 1

## 2021-03-26 MED ORDER — CEFDINIR 300 MG PO CAPS: 300.0000 mg | ORAL_CAPSULE | Freq: Two times a day (BID) | ORAL | 0 refills | Status: DC

## 2021-03-26 MED ORDER — SODIUM CHLORIDE 0.9 % IV SOLN
500.0000 mg | Freq: Once | INTRAVENOUS | Status: AC
  Administered 2021-03-26: 500 mg via INTRAVENOUS
  Filled 2021-03-26: qty 500

## 2021-03-26 NOTE — ED Notes (Signed)
This tech was unaware that pt was being held in triage, pt now back on the board

## 2021-03-26 NOTE — ED Notes (Signed)
Pt given an incentive spirometer

## 2021-03-26 NOTE — ED Notes (Signed)
Pt called 3x no answer  

## 2021-03-26 NOTE — Discharge Instructions (Addendum)
Patient will follow up with your primary doctor for a recheck in 1 week.

## 2021-03-26 NOTE — ED Provider Notes (Signed)
[facility] EMERGENCY DEPARTMENT Provider Note   CSN: [id number] Arrival date & time: 03/25/21  2131     History Chief Complaint  Patient presents with  . SOB/Abdominal Pain     Elevated D-dimer    [name] is a 50 y.o. female.  Patient presents to the emergency department for evaluation of shortness of breath.  Patient had scoliosis surgery in March.  She has noticed the difficulty breathing ever since and it has worsened.  Her primary physician ordered a D-dimer as an outpatient and it was elevated, patient referred to the ER.  Patient without fever, cough, chest pain.  Patient also complaining of abdominal discomfort.  She reports that she has felt distended since she was hospitalized.  No vomiting, no diarrhea.  No rectal bleeding.        History reviewed. No pertinent past medical history.  Patient Active Problem List   Diagnosis Date Noted  . Scoliosis (and kyphoscoliosis), idiopathic 02/06/2012    Past Surgical History:  Procedure Laterality Date  . ABDOMINAL HYSTERECTOMY    . HERNIA REPAIR       OB History   No obstetric history on file.     No family history on file.  Social History   Tobacco Use  . Smoking status: Never Smoker  Substance Use Topics  . Alcohol use: No  . Drug use: No    Home Medications Prior to Admission medications   Medication Sig Start Date End Date Taking? Authorizing Provider  acetaminophen (TYLENOL 8 HOUR) 650 MG CR tablet Take 1,300 mg by mouth every 8 (eight) hours as needed for pain.   Yes [provider]  ascorbic acid (VITAMIN C) 1000 MG tablet Take 1,000 mg by mouth daily.   Yes [provider]  azithromycin (ZITHROMAX) 250 MG tablet Take 1 tablet (250 mg total) by mouth daily. 03/26/21  Yes Arria Naim, Canary Brim, MD  B Complex Vitamins (VITAMIN B COMPLEX PO) Take 1 tablet by mouth daily.   Yes [provider]  CALCIUM PO Take 1 tablet by mouth daily.   Yes  [provider]  cefdinir (OMNICEF) 300 MG capsule Take 1 capsule (300 mg total) by mouth 2 (two) times daily. 03/26/21  Yes Adelyne Marchese, Canary Brim, MD  Cholecalciferol (VITAMIN D3 PO) Take 1 tablet by mouth daily.   Yes [provider]  cyclobenzaprine (FLEXERIL) 10 MG tablet Take 10 mg by mouth 3 (three) times daily. 03/25/21  Yes [provider]  diazepam (VALIUM) 5 MG tablet Take 5 mg by mouth in the morning, at noon, and at bedtime. 03/14/21  Yes [provider]  HYDROcodone-acetaminophen (NORCO/VICODIN) 5-325 MG tablet Take 0.25 tablets by mouth every 6 (six) hours. 03/12/21  Yes [provider]  omeprazole (PRILOSEC) 40 MG capsule Take 40 mg by mouth daily. 03/25/21  Yes [provider]  Probiotic Product (PROBIOTIC DAILY PO) Take 1 capsule by mouth daily.   Yes [provider]  senna-docusate (SENOKOT-S) 8.6-50 MG tablet Take 2 tablets by mouth at bedtime as needed for mild constipation. 02/18/21  Yes [provider]  Wheat Dextrin (BENEFIBER) POWD Take 1 Dose by mouth daily.   Yes [provider]  meloxicam (MOBIC) 15 MG tablet Take 15 mg by mouth every morning. Patient not taking: No sig reported 03/11/21   [provider]    Allergies    Ciprofloxacin, Fluoxetine, Robaxin [methocarbamol], Sertraline, and Venlafaxine  Review of Systems   Review of Systems  Respiratory: Positive for shortness of breath.   Gastrointestinal: Positive for abdominal distention.  All other systems reviewed and are negative.   Physical Exam Updated Vital Signs BP 117/86   Pulse 90   Temp 98.2 F (36.8 C) (Oral)   Resp 18   SpO2 100%   Physical Exam Vitals and nursing note reviewed.  Constitutional:      General: She is not in acute distress.    Appearance: Normal appearance. She is well-developed.  HENT:     Head: Normocephalic and atraumatic.     Right Ear: Hearing normal.     Left Ear: Hearing normal.      Nose: Nose normal.  Eyes:     Conjunctiva/sclera: Conjunctivae normal.     Pupils: Pupils are equal, round, and reactive to light.  Cardiovascular:     Rate and Rhythm: Regular rhythm.     Heart sounds: S1 normal and S2 normal. No murmur heard. No friction rub. No gallop.   Pulmonary:     Effort: Pulmonary effort is normal. No respiratory distress.     Breath sounds: Normal breath sounds.  Chest:     Chest wall: No tenderness.  Abdominal:     General: Bowel sounds are normal.     Palpations: Abdomen is soft.     Tenderness: There is no abdominal tenderness. There is no guarding or rebound. Negative signs include Murphy's sign and McBurney's sign.     Hernia: No hernia is present.  Musculoskeletal:        General: Normal range of motion.     Cervical back: Normal range of motion and neck supple.  Skin:    General: Skin is warm and dry.     Findings: No rash.  Neurological:     Mental Status: She is alert and oriented to person, place, and time.     GCS: GCS eye subscore is 4. GCS verbal subscore is 5. GCS motor subscore is 6.     Cranial Nerves: No cranial nerve deficit.     Sensory: No sensory deficit.     Coordination: Coordination normal.  Psychiatric:        Speech: Speech normal.        Behavior: Behavior normal.        Thought Content: Thought content normal.     ED Results / Procedures / Treatments   Labs (all labs ordered are listed, but only abnormal results are displayed) Labs Reviewed  COMPREHENSIVE METABOLIC PANEL - Abnormal; Notable for the following components:      Result Value   Glucose, Bld 114 (*)    All other components within normal limits  CBC WITH DIFFERENTIAL/PLATELET    EKG None  Radiology CT Angio Chest PE W and/or Wo Contrast  Result Date: 03/26/2021 CLINICAL DATA:  50 year old female with abdominal distension. EXAM: CT ANGIOGRAPHY CHEST CT ABDOMEN AND PELVIS WITH CONTRAST TECHNIQUE: Multidetector CT imaging of the chest was performed using  the standard protocol during bolus administration of intravenous contrast. Multiplanar CT image reconstructions and MIPs were obtained to evaluate the vascular anatomy. Multidetector CT imaging of the abdomen and pelvis was performed using the standard protocol during bolus administration of intravenous contrast. CONTRAST:  162mL OMNIPAQUE IOHEXOL 350 MG/ML SOLN COMPARISON:  Chest CT dated 10/10/2005. FINDINGS: Evaluation is limited due to streak artifact caused by spinal fusion hardware. CTA CHEST FINDINGS Cardiovascular: There is no cardiomegaly or pericardial effusion. The thoracic aorta is grossly unremarkable. No pulmonary artery embolus identified. Mediastinum/Nodes: There  is no hilar or mediastinal adenopathy. The esophagus is grossly unremarkable. No mediastinal fluid collection. Lungs/Pleura: Minimal bibasilar dependent atelectasis. No focal consolidation, pleural effusion, or pneumothorax. A cluster of nodular density in the right lower lobe (108/6) likely inflammatory or infectious in etiology. Clinical correlation and follow-up recommended. The central airways are patent. Musculoskeletal: Scoliosis and posterior fusion hardware. No acute osseous pathology. Review of the MIP images confirms the above findings. CT ABDOMEN and PELVIS FINDINGS No intra-abdominal free air.  Small free fluid in the pelvis. Hepatobiliary: No focal liver abnormality is seen. No gallstones, gallbladder wall thickening, or biliary dilatation. Pancreas: Unremarkable. No pancreatic ductal dilatation or surrounding inflammatory changes. Spleen: Normal in size without focal abnormality. Adrenals/Urinary Tract: The adrenal glands unremarkable. There is no hydronephrosis on either side. There is symmetric enhancement and excretion of contrast by both kidneys. The visualized ureters and urinary bladder appear unremarkable. Stomach/Bowel: There is no bowel obstruction or active inflammation. The appendix is not visualized with certainty.  No inflammatory changes identified in the right lower quadrant. Vascular/Lymphatic: The abdominal aorta and IVC unremarkable. No portal venous gas. There is no adenopathy. Reproductive: Hysterectomy.  No adnexal masses. Other: None Musculoskeletal: Scoliosis and posterior spinal fusion hardware. No acute osseous pathology. Review of the MIP images confirms the above findings. IMPRESSION: 1. A cluster of nodular density in the right lower lobe suspicious for an inflammatory/infectious process. Clinical correlation and follow-up recommended. No CT evidence of pulmonary embolism. 2. No acute intra-abdominal, or pelvic pathology. Electronically Signed   By: Anner Crete M.D.   On: 03/26/2021 02:44   CT ABDOMEN PELVIS W CONTRAST  Result Date: 03/26/2021 CLINICAL DATA:  50 year old female with abdominal distension. EXAM: CT ANGIOGRAPHY CHEST CT ABDOMEN AND PELVIS WITH CONTRAST TECHNIQUE: Multidetector CT imaging of the chest was performed using the standard protocol during bolus administration of intravenous contrast. Multiplanar CT image reconstructions and MIPs were obtained to evaluate the vascular anatomy. Multidetector CT imaging of the abdomen and pelvis was performed using the standard protocol during bolus administration of intravenous contrast. CONTRAST:  164mL OMNIPAQUE IOHEXOL 350 MG/ML SOLN COMPARISON:  Chest CT dated 10/10/2005. FINDINGS: Evaluation is limited due to streak artifact caused by spinal fusion hardware. CTA CHEST FINDINGS Cardiovascular: There is no cardiomegaly or pericardial effusion. The thoracic aorta is grossly unremarkable. No pulmonary artery embolus identified. Mediastinum/Nodes: There is no hilar or mediastinal adenopathy. The esophagus is grossly unremarkable. No mediastinal fluid collection. Lungs/Pleura: Minimal bibasilar dependent atelectasis. No focal consolidation, pleural effusion, or pneumothorax. A cluster of nodular density in the right lower lobe (108/6) likely  inflammatory or infectious in etiology. Clinical correlation and follow-up recommended. The central airways are patent. Musculoskeletal: Scoliosis and posterior fusion hardware. No acute osseous pathology. Review of the MIP images confirms the above findings. CT ABDOMEN and PELVIS FINDINGS No intra-abdominal free air.  Small free fluid in the pelvis. Hepatobiliary: No focal liver abnormality is seen. No gallstones, gallbladder wall thickening, or biliary dilatation. Pancreas: Unremarkable. No pancreatic ductal dilatation or surrounding inflammatory changes. Spleen: Normal in size without focal abnormality. Adrenals/Urinary Tract: The adrenal glands unremarkable. There is no hydronephrosis on either side. There is symmetric enhancement and excretion of contrast by both kidneys. The visualized ureters and urinary bladder appear unremarkable. Stomach/Bowel: There is no bowel obstruction or active inflammation. The appendix is not visualized with certainty. No inflammatory changes identified in the right lower quadrant. Vascular/Lymphatic: The abdominal aorta and IVC unremarkable. No portal venous gas. There is no adenopathy. Reproductive: Hysterectomy.  No adnexal masses. Other: None Musculoskeletal: Scoliosis and posterior spinal fusion hardware. No acute osseous pathology. Review of the MIP images confirms the above findings. IMPRESSION: 1. A cluster of nodular density in the right lower lobe suspicious for an inflammatory/infectious process. Clinical correlation and follow-up recommended. No CT evidence of pulmonary embolism. 2. No acute intra-abdominal, or pelvic pathology. Electronically Signed   By: Anner Crete M.D.   On: 03/26/2021 02:44    Procedures Procedures   Medications Ordered in ED Medications  cefTRIAXone (ROCEPHIN) 2 g in sodium chloride 0.9 % 100 mL IVPB (2 g Intravenous New Bag/Given 03/26/21 0421)  azithromycin (ZITHROMAX) 500 mg in sodium chloride 0.9 % 250 mL IVPB (has no  administration in time range)  iohexol (OMNIPAQUE) 350 MG/ML injection 100 mL (100 mLs Intravenous Contrast Given 03/26/21 0222)    ED Course  I have reviewed the triage vital signs and the nursing notes.  Pertinent labs & imaging results that were available during my care of the patient were reviewed by me and considered in my medical decision making (see chart for details).    MDM Rules/Calculators/A&P                          Patient presents after having an outpatient D-dimer drawn that was elevated.  There is concern for possible PE because of recent surgery.  Patient also complaining of abdominal discomfort and distention.  Patient had CT angio of chest and CT abdomen and pelvis.  Abdomen pelvis are normal.  CT PE study does not show any evidence of PE but does suggest infection.  Will treat for community-acquired pneumonia.  She has not hypoxic or in any distress, does not require hospitalization.  Patient had left thigh pain earlier this week.  No lower leg pain or swelling.  Symptoms not typical for DVT presentation.  Follow-up with PCP if symptoms continue. Final Clinical Impression(s) / ED Diagnoses Final diagnoses:  Community acquired pneumonia of right lower lobe of lung    Rx / DC Orders ED Discharge Orders         Ordered    cefdinir (OMNICEF) 300 MG capsule  2 times daily        03/26/21 0426    azithromycin (ZITHROMAX) 250 MG tablet  Daily        03/26/21 0426           Orpah Greek, MD 03/26/21 [phone]

## 2021-03-26 NOTE — ED Notes (Signed)
Patient transported to CT 

## 2021-03-30 ENCOUNTER — Other Ambulatory Visit: Payer: Self-pay | Admitting: Family Medicine

## 2021-03-30 DIAGNOSIS — R109 Unspecified abdominal pain: Secondary | ICD-10-CM

## 2021-04-05 ENCOUNTER — Other Ambulatory Visit: Payer: 59

## 2021-04-05 ENCOUNTER — Ambulatory Visit
Admission: RE | Admit: 2021-04-05 | Discharge: 2021-04-05 | Disposition: A | Discharge status: Home / Self Care | Payer: 59 | Source: Ambulatory Visit | Attending: Family Medicine | Admitting: Family Medicine

## 2021-04-05 ENCOUNTER — Other Ambulatory Visit: Payer: Self-pay | Admitting: Family Medicine

## 2021-04-05 DIAGNOSIS — J189 Pneumonia, unspecified organism: Secondary | ICD-10-CM

## 2021-04-29 ENCOUNTER — Ambulatory Visit
Admission: RE | Admit: 2021-04-29 | Discharge: 2021-04-29 | Disposition: A | Discharge status: Home / Self Care | Payer: 59 | Source: Ambulatory Visit | Attending: Family Medicine | Admitting: Family Medicine

## 2021-04-29 ENCOUNTER — Other Ambulatory Visit: Payer: Self-pay | Admitting: Family Medicine

## 2021-04-29 DIAGNOSIS — R0602 Shortness of breath: Secondary | ICD-10-CM

## 2021-05-31 NOTE — Progress Notes (Signed)
Cardiology Office Note:    Date:  06/01/2021   ID:  [name], DOB 1970-12-14, MRN [id number]  PCP:  Maurice Small, MD  Cardiologist:  Shirlee More, MD   Referring MD: Maurice Small, MD  ASSESSMENT:    1. Inappropriate sinus tachycardia   2. SOB (shortness of breath)    PLAN:    In order of problems listed above:  She has unusual constellation of problems including inappropriate sinus tachycardia and positional shortness of breath.  I have asked her to have an echocardiogram done as quickly as possible trend her heart rates and blood pressures at home and will start on a low-dose of the beta-blocker.  We will apply a 3-day ZIO monitor to screen her heart rate distribution and if unimproved a selective sinus node blocker ivabradine would be appropriate.  Next appointment 3 weeks   Medication Adjustments/Labs and Tests Ordered: Current medicines are reviewed at length with the patient today.  Concerns regarding medicines are outlined above.  No orders of the defined types were placed in this encounter.  No orders of the defined types were placed in this encounter.    Chief Complaint  Patient presents with   Tachycardia   Shortness of Breath    History of Present Illness:    [name] is a 50 y.o. female who is being seen today for the evaluation of rapid heart rate with sinus tachycardia and shortness of breath at the request of Maurice Small, MD.  She was seen [facility] ED 03/25/2021 for shortness of breath with scoliosis surgery earlier this year in March at [facility]..  The ED note says she has been short of breath since that time she had an outpatient D-dimer ordered that was elevated and referred to the emergency room.  Chest CTA pulmonary embolism protocol showed findings suspicious for right lower lobe inflammatory infectious process no evidence of heart failure and normal cardiovascular structures.  She was treated for  community-acquired pneumonia.  In the emergency room her respiratory rate was 18 sat 100% pulse 90 temperature 98.2 and blood pressure 117/76 CBC showed a normal hemoglobin 13.1 and CMP was normal except for glucose of 114.  Surgical note 02/14/2021 revealed that she had fusion from T4-L4 with bone grafting.  Her vital signs record heart rates of up to 125 bpm There are no cardiology notes available within epic and no cardiac testing.  She has not done well since her surgery she has abdominal distention pain week tremulous short of breath especially when sitting up and with activity and has had very rapid heart rate. This is a new problem since her surgery. She has no history of congenital rheumatic heart disease and no arrhythmia in the past She is taking no medications accelerate her heart rate She relates recent lab work extensive including thyroid normal and CBC normal although she did require multiple transfusions perioperatively. No chest pain or syncope. She is not having edema orthopnea no fever or chills.  History reviewed. No pertinent past medical history.  Past Surgical History:  Procedure Laterality Date   ABDOMINAL HYSTERECTOMY     BREAST LUMPECTOMY Bilateral    HERNIA REPAIR     SPINAL FUSION     TONSILLECTOMY      Current Medications: Current Meds  Medication Sig   acetaminophen (TYLENOL) 650 MG CR tablet Take 1,300 mg by mouth every 8 (eight) hours as needed for pain.   ascorbic acid (VITAMIN C) 1000 MG tablet  Take 1,000 mg by mouth daily.   B Complex Vitamins (VITAMIN B COMPLEX PO) Take 1 tablet by mouth daily.   CALCIUM PO Take 1,000 mg by mouth daily.   Cholecalciferol (VITAMIN D3 PO) Take 1 tablet by mouth daily.   diazepam (VALIUM) 5 MG tablet Take 5 mg by mouth in the morning, at noon, and at bedtime.   diphenhydrAMINE (SOMINEX) 25 MG tablet Take 25 mg by mouth at bedtime as needed for sleep.   Ibuprofen 200 MG CAPS Take 2 capsules by mouth every 6 (six) hours  as needed for pain.   magnesium oxide (MAG-OX) 400 MG tablet Take 400 mg by mouth daily.   Probiotic Product (PROBIOTIC DAILY PO) Take 1 capsule by mouth daily.     Allergies:   Ciprofloxacin, Fluoxetine, Robaxin [methocarbamol], Sertraline, and Venlafaxine   Social History   Socioeconomic History   Marital status: Married    Spouse name: Not on file   Number of children: Not on file   Years of education: Not on file   Highest education level: Not on file  Occupational History   Not on file  Tobacco Use   Smoking status: Never   Smokeless tobacco: Never  Vaping Use   Vaping Use: Never used  Substance and Sexual Activity   Alcohol use: No   Drug use: No   Sexual activity: Not on file  Other Topics Concern   Not on file  Social History Narrative   Not on file   Social Determinants of Health   Financial Resource Strain: Not on file  Food Insecurity: Not on file  Transportation Needs: Not on file  Physical Activity: Not on file  Stress: Not on file  Social Connections: Not on file     Family History: The patient's family history includes Autoimmune disease in her mother; Breast cancer in her paternal grandmother; Diabetes in her brother; Heart disease in her father, maternal grandfather, maternal grandmother, and paternal grandfather; Scoliosis in her mother; Suicidality in her brother.  ROS:   ROS Please see the history of present illness.     All other systems reviewed and are negative.  EKGs/Labs/Other Studies Reviewed:    The following studies were reviewed today:   EKG:  EKG is  ordered today.  The ekg ordered today is personally reviewed and demonstrates sinus tachycardia Baseline tremor with artifact left atrium appears enlarged and a single PVC  Recent Labs: 03/25/2021: ALT 21; BUN 13; Creatinine, Ser 0.68; Hemoglobin 13.1; Platelets 338; Potassium 3.6; Sodium 135  Recent Lipid Panel No results found for: CHOL, TRIG, HDL, CHOLHDL, VLDL, LDLCALC,  LDLDIRECT  Physical Exam:    VS:  BP (!) 146/94 (BP Location: Right Arm, Patient Position: Sitting, Cuff Size: Small)   Pulse (!) 125   Ht 5\' 6"  (1.676 m)   Wt 128 lb 12.8 oz (58.4 kg)   SpO2 98%   BMI 20.79 kg/m     Wt Readings from Last 3 Encounters:  06/01/21 128 lb 12.8 oz (58.4 kg)  03/09/14 125 lb (56.7 kg)  02/09/12 128 lb (58.1 kg)     GEN: She is very pale tremulous anxious tachycardic.  There is no clubbing no cyanosis no pallor of her conjunctiva well nourished, well developed in no acute distress HEENT: Normal NECK: No JVD; No carotid bruits LYMPHATICS: No lymphadenopathy CARDIAC: RRR, no murmurs, rubs, gallops RESPIRATORY:  Clear to auscultation without rales, wheezing or rhonchi  ABDOMEN: Soft, non-tender, non-distended MUSCULOSKELETAL:  No edema; No  deformity  SKIN: Warm and dry NEUROLOGIC:  Alert and oriented x 3 PSYCHIATRIC:  Normal affect     Signed, Shirlee More, MD  06/01/2021 3:24 PM    [facility]

## 2021-06-01 ENCOUNTER — Other Ambulatory Visit: Payer: Self-pay

## 2021-06-01 ENCOUNTER — Ambulatory Visit (INDEPENDENT_AMBULATORY_CARE_PROVIDER_SITE_OTHER): Payer: 59 | Admitting: Cardiology

## 2021-06-01 ENCOUNTER — Encounter: Payer: Self-pay | Admitting: Cardiology

## 2021-06-01 ENCOUNTER — Ambulatory Visit (INDEPENDENT_AMBULATORY_CARE_PROVIDER_SITE_OTHER): Payer: 59

## 2021-06-01 VITALS — BP 146/94 | HR 125 | Ht 66.0 in | Wt 128.8 lb

## 2021-06-01 DIAGNOSIS — R0602 Shortness of breath: Secondary | ICD-10-CM

## 2021-06-01 DIAGNOSIS — R000 Tachycardia, unspecified: Secondary | ICD-10-CM | POA: Diagnosis not present

## 2021-06-01 MED ORDER — METOPROLOL TARTRATE 25 MG PO TABS: 25.0000 mg | ORAL_TABLET | Freq: Two times a day (BID) | ORAL | 3 refills | Status: DC

## 2021-06-01 NOTE — Patient Instructions (Signed)
Medication Instructions:  Your physician has recommended you make the following change in your medication:   Start Lopressor 12.5 mg three times daily.  *If you need a refill on your cardiac medications before your next appointment, please call your pharmacy*   Lab Work: None ordered If you have labs (blood work) drawn today and your tests are completely normal, you will receive your results only by: [place] (if you have MyChart) OR A paper copy in the mail If you have any lab test that is abnormal or we need to change your treatment, we will call you to review the results.   Testing/Procedures: Your physician has requested that you have an echocardiogram. Echocardiography is a painless test that uses sound waves to create images of your heart. It provides your doctor with information about the size and shape of your heart and how well your heart's chambers and valves are working. This procedure takes approximately one hour. There are no restrictions for this procedure.   WHY IS MY DOCTOR PRESCRIBING ZIO? The Zio system is proven and trusted by physicians to detect and diagnose irregular heart rhythms -- and has been prescribed to hundreds of thousands of patients.  The FDA has cleared the Zio system to monitor for many different kinds of irregular heart rhythms. In a study, physicians were able to reach a diagnosis 90% of the time with the Zio system1.  You can wear the Zio monitor -- a small, discreet, comfortable patch -- during your normal day-to-day activity, including while you sleep, shower, and exercise, while it records every single heartbeat for analysis.  1Barrett, P., et al. Comparison of 24 Hour Holter Monitoring Versus 14 Day Novel Adhesive Patch Electrocardiographic Monitoring. [place], 2014.  ZIO VS. HOLTER MONITORING The Zio monitor can be comfortably worn for up to 14 days. Holter monitors can be worn for 24 to 48 hours, limiting the time  to record any irregular heart rhythms you may have. Zio is able to capture data for the 51% of patients who have their first symptom-triggered arrhythmia after 48 hours.1  LIVE WITHOUT RESTRICTIONS The Zio ambulatory cardiac monitor is a small, unobtrusive, and water-resistant patch--you might even forget you're wearing it. The Zio monitor records and stores every beat of your heart, whether you're sleeping, working out, or showering. Wear the monitor for 7 days. Remove 06/08/21.  Follow-Up: At [facility], you and your health needs are our priority.  As part of our continuing mission to provide you with exceptional heart care, we have created designated Provider Care Teams.  These Care Teams include your primary Cardiologist (physician) and Advanced Practice Providers (APPs -  Physician Assistants and Nurse Practitioners) who all work together to provide you with the care you need, when you need it.  We recommend signing up for the patient portal called "MyChart".  Sign up information is provided on this After Visit Summary.  MyChart is used to connect with patients for Virtual Visits (Telemedicine).  Patients are able to view lab/test results, encounter notes, upcoming appointments, etc.  Non-urgent messages can be sent to your provider as well.   To learn more about what you can do with MyChart, go to [email or web address].    Your next appointment:   3 week(s)  The format for your next appointment:   In Person  Provider:   Shirlee More, MD   Other Instructions Echocardiogram An echocardiogram is a test that uses sound waves (ultrasound) to produce images of the heart.  Images from an echocardiogram can provide important information about: Heart size and shape. The size and thickness and movement of your heart's walls. Heart muscle function and strength. Heart valve function or if you have stenosis. Stenosis is when the heart valves are too narrow. If blood is flowing backward  through the heart valves (regurgitation). A tumor or infectious growth around the heart valves. Areas of heart muscle that are not working well because of poor blood flow or injury from a heart attack. Aneurysm detection. An aneurysm is a weak or damaged part of an artery wall. The wall bulges out from the normal force of blood pumping through the body. Tell a health care provider about: Any allergies you have. All medicines you are taking, including vitamins, herbs, eye drops, creams, and over-the-counter medicines. Any blood disorders you have. Any surgeries you have had. Any medical conditions you have. Whether you are pregnant or may be pregnant. What are the risks? Generally, this is a safe test. However, problems may occur, including an allergic reaction to dye (contrast) that may be used during the test. What happens before the test? No specific preparation is needed. You may eat and drink normally. What happens during the test? You will take off your clothes from the waist up and put on a hospital gown. Electrodes or electrocardiogram (ECG)patches may be placed on your chest. The electrodes or patches are then connected to a device that monitors your heart rate and rhythm. You will lie down on a table for an ultrasound exam. A gel will be applied to your chest to help sound waves pass through your skin. A handheld device, called a transducer, will be pressed against your chest and moved over your heart. The transducer produces sound waves that travel to your heart and bounce back (or "echo" back) to the transducer. These sound waves will be captured in real-time and changed into images of your heart that can be viewed on a video monitor. The images will be recorded on a computer and reviewed by your health care provider. You may be asked to change positions or hold your breath for a short time. This makes it easier to get different views or better views of your heart. In some cases, you  may receive contrast through an IV in one of your veins. This can improve the quality of the pictures from your heart. The procedure may vary among health care providers and hospitals.   What can I expect after the test? You may return to your normal, everyday life, including diet, activities, and medicines, unless your health care provider tells you not to do that. Follow these instructions at home: It is up to you to get the results of your test. Ask your health care provider, or the department that is doing the test, when your results will be ready. Keep all follow-up visits. This is important. Summary An echocardiogram is a test that uses sound waves (ultrasound) to produce images of the heart. Images from an echocardiogram can provide important information about the size and shape of your heart, heart muscle function, heart valve function, and other possible heart problems. You do not need to do anything to prepare before this test. You may eat and drink normally. After the echocardiogram is completed, you may return to your normal, everyday life, unless your health care provider tells you not to do that. This information is not intended to replace advice given to you by your health care provider. Make sure you  discuss any questions you have with your health care provider. Document Revised: 06/29/2020 Document Reviewed: 06/29/2020 Elsevier Patient Education  [place]

## 2021-06-03 ENCOUNTER — Telehealth: Payer: Self-pay | Admitting: Cardiology

## 2021-06-03 MED ORDER — METOPROLOL TARTRATE 25 MG PO TABS: 12.5000 mg | ORAL_TABLET | Freq: Three times a day (TID) | ORAL | 3 refills | Status: DC

## 2021-06-03 NOTE — Telephone Encounter (Signed)
Pt advised that she is to take 0.5 mg three times daily. Pt verbalized understanding and had no additional questions.

## 2021-06-03 NOTE — Addendum Note (Signed)
Addended by: Truddie Hidden on: 06/03/2021 12:43 PM   Modules accepted: Orders

## 2021-06-03 NOTE — Telephone Encounter (Signed)
Pt c/o medication issue:  1. Name of Medication:  metoprolol tartrate (LOPRESSOR) 25 MG tablet  2. How are you currently taking this medication (dosage and times per day)? Started it last night, took one dose last night and one dose this morning  3. Are you having a reaction (difficulty breathing--STAT)? No   4. What is your medication issue? Quinnie is calling confused by this medications instructions. On the AVS  it states take 12.5 MG's 3x a day then it also states in another place for her take 25 mg's twice a day. The prescription she received also states the 25 MG's twice a day. She is just wanting to confirm what MG's and how often to take this. Please advise.

## 2021-06-08 ENCOUNTER — Ambulatory Visit (HOSPITAL_COMMUNITY): Payer: 59 | Attending: Cardiology

## 2021-06-08 ENCOUNTER — Telehealth: Payer: Self-pay

## 2021-06-08 ENCOUNTER — Other Ambulatory Visit: Payer: Self-pay

## 2021-06-08 DIAGNOSIS — R0602 Shortness of breath: Secondary | ICD-10-CM | POA: Diagnosis present

## 2021-06-08 LAB — ECHOCARDIOGRAM COMPLETE
Area-P 1/2: 8.62 cm2
S' Lateral: 2.9 cm

## 2021-06-08 MED ORDER — IVABRADINE HCL 5 MG PO TABS: 5.0000 mg | ORAL_TABLET | Freq: Two times a day (BID) | ORAL | 3 refills | Status: DC

## 2021-06-08 NOTE — Telephone Encounter (Signed)
-----   Message from Richardo Priest, MD sent at 06/08/2021  2:18 PM EDT ----- I reviewed her echocardiogram her 3D EF is 50% and I would agree that its low normal and I do not think she has hypokinesia.  I am encouraged that her resting heart rate was in the 80s I hope she is feeling better.

## 2021-06-08 NOTE — Progress Notes (Unsigned)
Talked to Dr. Acie Fredrickson about echo. Okay to let patient leave.

## 2021-06-08 NOTE — Telephone Encounter (Signed)
Spoke with patient regarding results and recommendation.  The patient states that she does not feel much better. She is still short of breath and she thinks she feels worse while taking the lopressor. She is going to stop the medication at this time. She wants to know if Dr. Bettina Gavia has any other recommendations for her because she does not feel much better.  She mailed her heart monitor back in today. We should be able to get these results within the next two weeks.

## 2021-06-08 NOTE — Addendum Note (Signed)
Addended by: Resa Miner I on: 06/08/2021 03:20 PM   Modules accepted: Orders

## 2021-06-08 NOTE — Telephone Encounter (Signed)
Spoke to the patient just now and let her know Dr. Joya Gaskins recommendations. She verbalizes understanding and thanks me for the call back.

## 2021-06-10 DIAGNOSIS — R000 Tachycardia, unspecified: Secondary | ICD-10-CM

## 2021-06-14 ENCOUNTER — Telehealth: Payer: Self-pay

## 2021-06-14 NOTE — Telephone Encounter (Signed)
PA started on CMM for Corlanor 5 mg. Key [id number]

## 2021-06-16 ENCOUNTER — Telehealth: Payer: Self-pay

## 2021-06-16 NOTE — Telephone Encounter (Signed)
Spoke with patient regarding results and recommendation.  Patient verbalizes understanding and is agreeable to plan of care. Advised patient to call back with any issues or concerns.  

## 2021-06-16 NOTE — Telephone Encounter (Signed)
-----   Message from Richardo Priest, MD sent at 06/16/2021  1:53 PM EDT ----- This is a little unexpected, her heart rates are not particularly rapid however she may have been taking a beta-blocker at that time.  She does have his frequent ventricular premature beats and I think she should follow-up and see electrophysiology regarding further evaluation and treatment

## 2021-06-20 ENCOUNTER — Ambulatory Visit (INDEPENDENT_AMBULATORY_CARE_PROVIDER_SITE_OTHER): Payer: 59 | Admitting: Internal Medicine

## 2021-06-20 ENCOUNTER — Other Ambulatory Visit: Payer: Self-pay

## 2021-06-20 VITALS — BP 108/70 | HR 115 | Ht 66.0 in | Wt 129.2 lb

## 2021-06-20 DIAGNOSIS — R000 Tachycardia, unspecified: Secondary | ICD-10-CM | POA: Diagnosis not present

## 2021-06-20 NOTE — Progress Notes (Signed)
Electrophysiology Office Note   Date:  06/20/2021   ID:  Adrinna Washington, DOB [birth year], MRN [id number]  PCP:  Maurice Small, MD  Cardiologist:  Dr Bettina Gavia  CC: tachycardia   History of Present Illness: [name] is a 50 y.o. female who presents today for electrophysiology evaluation.   She is referred by Dr Bettina Gavia for EP consultation regarding postural tachycardia.  She reports being in reasonably good health until 3/22 when she had surgery for scoliosis.  Since that time, she has had multiple concerns including L arm tremor, abdominal distention, and elevation of heart rates with sitting and standing.  She has had a rather extensive workup which has mostly been unrevealing.  Zio revealed average heart rate in the 80s  she had rare PACs, PVCs, and nonsustained short atach and NSVT without symptoms.   She reports heart rate elevation when sitting or exerting but not when supine and resting.  She has associated SOB and vague chest discomfort. Denies dizziness, presyncope or syncope.   No past medical history on file. Past Surgical History:  Procedure Laterality Date   ABDOMINAL HYSTERECTOMY     BREAST LUMPECTOMY Bilateral    HERNIA REPAIR     SPINAL FUSION     TONSILLECTOMY       Current Outpatient Medications  Medication Sig Dispense Refill   acetaminophen (TYLENOL) 650 MG CR tablet Take 1,300 mg by mouth every 8 (eight) hours as needed for pain.     ascorbic acid (VITAMIN C) 1000 MG tablet Take 1,000 mg by mouth daily.     B Complex Vitamins (VITAMIN B COMPLEX PO) Take 1 tablet by mouth daily.     CALCIUM PO Take 1,000 mg by mouth daily.     Cholecalciferol (VITAMIN D3 PO) Take 1 tablet by mouth daily.     diazepam (VALIUM) 5 MG tablet Take 5 mg by mouth in the morning, at noon, and at bedtime.     diphenhydrAMINE (SOMINEX) 25 MG tablet Take 25 mg by mouth at bedtime as needed for sleep.     Ibuprofen 200 MG CAPS Take 2 capsules by mouth every 6 (six)  hours as needed for pain.     magnesium oxide (MAG-OX) 400 MG tablet Take 400 mg by mouth daily.     Probiotic Product (PROBIOTIC DAILY PO) Take 1 capsule by mouth daily.     No current facility-administered medications for this visit.    Allergies:   Ciprofloxacin, Fluoxetine, Robaxin [methocarbamol], Sertraline, and Venlafaxine   Social History:  The patient  reports that she has never smoked. She has never used smokeless tobacco. She reports that she does not drink alcohol and does not use drugs.   Family History:  The patient's  family history includes Autoimmune disease in her mother; Breast cancer in her paternal grandmother; Diabetes in her brother; Heart disease in her father, maternal grandfather, maternal grandmother, and paternal grandfather; Scoliosis in her mother; Suicidality in her brother.    ROS:  Please see the history of present illness.   All other systems are personally reviewed and negative.    PHYSICAL EXAM: VS:  BP 108/70   Pulse (!) 115   Ht '5\' 6"'$  (1.676 m)   Wt 129 lb 3.2 oz (58.6 kg)   SpO2 99%   BMI 20.85 kg/m  , BMI Body mass index is 20.85 kg/m. GEN: very anxious appearing, resting R arm tremor,  moves very slowly and is accompanied by her significant other who  assists with ambulation HEENT: normal Neck: no JVD, carotid bruits, or masses Cardiac: RRR; no murmurs, rubs, or gallops,no edema  Respiratory:  clear to auscultation bilaterally, normal work of breathing GI: soft, but mildly distended MS: no deformity or atrophy Skin: warm and dry  Neuro:  Strength and sensation are intact, R arm tremor is noted Psych: anxious  EKG:  EKG is ordered today. The ekg ordered today is personally reviewed and shows sinus tachycardia    Recent Labs: 03/25/2021: ALT 21; BUN 13; Creatinine, Ser 0.68; Hemoglobin 13.1; Platelets 338; Potassium 3.6; Sodium 135  personally reviewed   Lipid Panel  No results found for: CHOL, TRIG, HDL, CHOLHDL, VLDL, LDLCALC,  LDLDIRECT personally reviewed   Wt Readings from Last 3 Encounters:  06/20/21 129 lb 3.2 oz (58.6 kg)  06/01/21 128 lb 12.8 oz (58.4 kg)  03/09/14 125 lb (56.7 kg)      Other studies personally reviewed: Additional studies/ records that were reviewed today include: prior echo, CTA of chest and abdomin, labs, Dr Oren Binet notes  Review of the above records today demonstrates: as above   ASSESSMENT AND PLAN:  1.  Sinus tachycardia Zio monitor is reviewed and is NOT consistent with inappropriate sinus tachycardia.  She appears to have reactive sinus tachycardia.  She is quite deconditioned.  She has some degree of anxiety.  She is orthostatic by heart rate today.  Recent blood work from PCP shows normal TSH.  She did have BUN 20, Creat 0.8 She appears dry today Adequate hydration and salt liberalization.  Regular exercise and physical therapy are advised with increased activity as tolerated. No indication for EP procedures or further worukp  2. Atrial and ventricular ectopy Asymptomatic Benign Would not advise treatment     Follow-up:  with primary cardiologist I will see as needed   Signed, Thompson Grayer, MD  06/20/2021 11:24 AM     [facility] [address] [place] [address] [phone] (office) [phone] (fax)

## 2021-06-20 NOTE — Patient Instructions (Addendum)
Medication Instructions:  Your physician recommends that you continue on your current medications as directed. Please refer to the Current Medication list given to you today.  Labwork: None ordered.  Testing/Procedures: None ordered.  Follow-Up: Your physician wants you to follow-up in: As needed with Dr. Rayann Heman   Any Other Special Instructions Will Be Listed Below (If Applicable).  If you need a refill on your cardiac medications before your next appointment, please call your pharmacy.   Use plenty of salt in diet/cooking and increase fluid intake to help with symptoms from changes positions.

## 2021-06-21 NOTE — Telephone Encounter (Signed)
PA denied on CMM for Corlanor 5 mg.

## 2021-06-23 NOTE — Progress Notes (Signed)
`  Cardiology Office Note:    Date:  06/24/2021   ID:  [name], DOB [birth year], MRN [id number]  PCP:  Maurice Small, MD  Cardiologist:  Shirlee More, MD    Referring MD: Maurice Small, MD    ASSESSMENT:    1. Sinus tachycardia   2. Frequent PVCs    PLAN:    In order of problems listed above:  Although she does not fit criteria for inappropriate tachycardia she clearly has autonomic dysfunction following her extensive spine surgery with sinus tachycardia and frequent PVCs she is improving continue endeavors of salt and water loading physical therapy is beneficial continue her walking every day I am going to put her on a very low-dose of beta-blocker to alleviate PVCs and block the resting tachycardia.  I have asked her to track heart rate and blood pressure and to send me a note in about 2 weeks with her response.   Next appointment: 3 months   Medication Adjustments/Labs and Tests Ordered: Current medicines are reviewed at length with the patient today.  Concerns regarding medicines are outlined above.  No orders of the defined types were placed in this encounter.  No orders of the defined types were placed in this encounter.   Chief Complaint  Patient presents with   Follow-up    History of Present Illness:    [name] is a 50 y.o. female with a hx of rapid heart rate sinus tachycardia shortness of breath following surgery last seen by me 06/01/2021.  When initially seen she had sustained resting heart rates exceeding 120 bpm.  She was seen [facility] ED 03/25/2021 for shortness of breath with scoliosis surgery earlier this year in March at [facility]..  The ED note says she has been short of breath since that time she had an outpatient D-dimer ordered that was elevated and referred to the emergency room.  Chest CTA pulmonary embolism protocol showed findings suspicious for right lower lobe inflammatory infectious process no evidence  of heart failure and normal cardiovascular structures.  She was treated for community-acquired pneumonia.  In the emergency room her respiratory rate was 18 sat 100% pulse 90 temperature 98.2 and blood pressure 117/76 CBC showed a normal hemoglobin 13.1 and CMP was normal except for glucose of 114.   Surgical note 02/14/2021 revealed that she had fusion from T4-L4 with bone grafting.  Her vital signs record heart rates of up to 125 bpm  Compliance with diet, lifestyle and medications: Yes  She took message to hold from EP she is fluid loaded herself as opposed to salt tablets she uses a liquid IV fluid when she puts into water and she is improved every once while she gets it 30 bpm increased when she stands but her heart rates are mostly 100 210 bpm blood pressures greater than 100 except for 1 episode in the 90s she is now walking 1 mile a day.  She still gets episodes of palpitation. When she took a beta-blocker she had fatigue and I think we can do is try a more selective beta-blocker and use small doses every other day to blunt her tachycardia and alleviate PVCs She will be starting physical therapy which is very helpful for these autonomic dysfunction syndromes. She has a neurosurgery follow-up scheduled in [place] I reassured her that I personally looked at her echocardiogram and its normal  Echocardiogram performed 06/08/2021 showed mildly diminished ejection fraction 45 to 50% with global hypokinesia normal right  ventricular size function and pulmonary artery pressure she has mild mitral regurgitation trivial aortic regurgitation and had frequent PVCs throughout the test  I independently reviewed her echocardiogram diastolic function pulmonary veins are normal.  Frequent PVCs are present and I feel left ventricular systolic function is normal EF at least in the range of 50 to 55% calculated at 50%.  I would not find this abnormal.  She subsequently used an event monitor showing frequent  ventricular ectopy with couplets triplets and one 4 beat run of PVCs.  Because of frequent PVCs I referred her for EP evaluation.  She had EP evaluation and was felt to have sinus tachycardia  and the ventricular arrhythmia asymptomatic no treatment was advised and had a recommendation for hydration add salt to the diet and physical activity.  History reviewed. No pertinent past medical history.  Past Surgical History:  Procedure Laterality Date   ABDOMINAL HYSTERECTOMY     BREAST LUMPECTOMY Bilateral    HERNIA REPAIR     SPINAL FUSION     TONSILLECTOMY      Current Medications: Current Meds  Medication Sig   acetaminophen (TYLENOL) 650 MG CR tablet Take 1,300 mg by mouth every 8 (eight) hours as needed for pain.   ascorbic acid (VITAMIN C) 1000 MG tablet Take 1,000 mg by mouth daily.   B Complex Vitamins (VITAMIN B COMPLEX PO) Take 1 tablet by mouth daily.   CALCIUM PO Take 1,000 mg by mouth daily.   Cholecalciferol (VITAMIN D3 PO) Take 1 tablet by mouth daily.   diazepam (VALIUM) 5 MG tablet Take 5 mg by mouth in the morning, at noon, and at bedtime.   diphenhydrAMINE (SOMINEX) 25 MG tablet Take 25 mg by mouth at bedtime as needed for sleep.   Ibuprofen 200 MG CAPS Take 2 capsules by mouth every 6 (six) hours as needed for pain.   magnesium oxide (MAG-OX) 400 MG tablet Take 400 mg by mouth daily.   Probiotic Product (PROBIOTIC DAILY PO) Take 1 capsule by mouth daily.     Allergies:   Ciprofloxacin, Fluoxetine, Robaxin [methocarbamol], Sertraline, and Venlafaxine   Social History   Socioeconomic History   Marital status: Married    Spouse name: Not on file   Number of children: Not on file   Years of education: Not on file   Highest education level: Not on file  Occupational History   Not on file  Tobacco Use   Smoking status: Never   Smokeless tobacco: Never  Vaping Use   Vaping Use: Never used  Substance and Sexual Activity   Alcohol use: No   Drug use: No    Sexual activity: Not on file  Other Topics Concern   Not on file  Social History Narrative   Not on file   Social Determinants of Health   Financial Resource Strain: Not on file  Food Insecurity: Not on file  Transportation Needs: Not on file  Physical Activity: Not on file  Stress: Not on file  Social Connections: Not on file     Family History: The patient's family history includes Autoimmune disease in her mother; Breast cancer in her paternal grandmother; Diabetes in her brother; Heart disease in her father, maternal grandfather, maternal grandmother, and paternal grandfather; Scoliosis in her mother; Suicidality in her brother. ROS:   Please see the history of present illness.    All other systems reviewed and are negative.  EKGs/Labs/Other Studies Reviewed:    The following studies were reviewed  today:    Recent Labs: 03/25/2021: ALT 21; BUN 13; Creatinine, Ser 0.68; Hemoglobin 13.1; Platelets 338; Potassium 3.6; Sodium 135  Recent Lipid Panel No results found for: CHOL, TRIG, HDL, CHOLHDL, VLDL, LDLCALC, LDLDIRECT  Physical Exam:    VS:  BP 100/60 (BP Location: Right Arm, Patient Position: Sitting, Cuff Size: Small)   Pulse (!) 112   Ht '5\' 6"'$  (1.676 m)   Wt 129 lb (58.5 kg)   SpO2 99%   BMI 20.82 kg/m     Wt Readings from Last 3 Encounters:  06/24/21 129 lb (58.5 kg)  06/20/21 129 lb 3.2 oz (58.6 kg)  06/01/21 128 lb 12.8 oz (58.4 kg)     GEN: She is improved tremulous but better looks stronger well nourished, well developed in no acute distress HEENT: Normal NECK: No JVD; No carotid bruits LYMPHATICS: No lymphadenopathy CARDIAC: RRR, no murmurs, rubs, gallops RESPIRATORY:  Clear to auscultation without rales, wheezing or rhonchi  ABDOMEN: Soft, non-tender, non-distended MUSCULOSKELETAL:  No edema; No deformity  SKIN: Warm and dry NEUROLOGIC:  Alert and oriented x 3 PSYCHIATRIC:  Normal affect    Signed, Shirlee More, MD  06/24/2021 1:37 PM    [facility]

## 2021-06-24 ENCOUNTER — Ambulatory Visit (INDEPENDENT_AMBULATORY_CARE_PROVIDER_SITE_OTHER): Payer: 59 | Admitting: Cardiology

## 2021-06-24 ENCOUNTER — Other Ambulatory Visit: Payer: 59

## 2021-06-24 ENCOUNTER — Encounter: Payer: Self-pay | Admitting: Cardiology

## 2021-06-24 ENCOUNTER — Other Ambulatory Visit: Payer: Self-pay

## 2021-06-24 VITALS — BP 100/60 | HR 112 | Ht 66.0 in | Wt 129.0 lb

## 2021-06-24 DIAGNOSIS — I493 Ventricular premature depolarization: Secondary | ICD-10-CM | POA: Diagnosis not present

## 2021-06-24 DIAGNOSIS — I519 Heart disease, unspecified: Secondary | ICD-10-CM

## 2021-06-24 DIAGNOSIS — R000 Tachycardia, unspecified: Secondary | ICD-10-CM

## 2021-06-24 MED ORDER — ACEBUTOLOL HCL 200 MG PO CAPS: 200.0000 mg | ORAL_CAPSULE | ORAL | 3 refills | Status: DC

## 2021-06-24 NOTE — Patient Instructions (Signed)
Medication Instructions:  Your physician has recommended you make the following change in your medication: START Sectral 200 mg take one tablet by mouth every other day.  *If you need a refill on your cardiac medications before your next appointment, please call your pharmacy*   Lab Work: None If you have labs (blood work) drawn today and your tests are completely normal, you will receive your results only by: [place] (if you have MyChart) OR A paper copy in the mail If you have any lab test that is abnormal or we need to change your treatment, we will call you to review the results.   Testing/Procedures: None   Follow-Up: At [facility], you and your health needs are our priority.  As part of our continuing mission to provide you with exceptional heart care, we have created designated Provider Care Teams.  These Care Teams include your primary Cardiologist (physician) and Advanced Practice Providers (APPs -  Physician Assistants and Nurse Practitioners) who all work together to provide you with the care you need, when you need it.  We recommend signing up for the patient portal called "MyChart".  Sign up information is provided on this After Visit Summary.  MyChart is used to connect with patients for Virtual Visits (Telemedicine).  Patients are able to view lab/test results, encounter notes, upcoming appointments, etc.  Non-urgent messages can be sent to your provider as well.   To learn more about what you can do with MyChart, go to [email or web address].    Your next appointment:   3 month(s)  The format for your next appointment:   In Person  Provider:   Shirlee More, MD   Other Instructions In two weeks please send [place] a list of your blood pressure and heart rates via MyChart.

## 2021-06-29 MED ORDER — MIDODRINE HCL 5 MG PO TABS: 5.0000 mg | ORAL_TABLET | Freq: Two times a day (BID) | ORAL | 3 refills | Status: DC

## 2021-06-29 MED ORDER — ACEBUTOLOL HCL 200 MG PO CAPS: 200.0000 mg | ORAL_CAPSULE | Freq: Every day | ORAL | 3 refills | Status: DC

## 2021-07-01 ENCOUNTER — Other Ambulatory Visit: Payer: Self-pay

## 2021-07-01 ENCOUNTER — Encounter: Payer: Self-pay | Admitting: Physical Therapy

## 2021-07-01 ENCOUNTER — Ambulatory Visit: Payer: 59 | Attending: Family Medicine | Admitting: Physical Therapy

## 2021-07-01 DIAGNOSIS — M546 Pain in thoracic spine: Secondary | ICD-10-CM | POA: Insufficient documentation

## 2021-07-01 DIAGNOSIS — R2689 Other abnormalities of gait and mobility: Secondary | ICD-10-CM

## 2021-07-01 DIAGNOSIS — M545 Low back pain, unspecified: Secondary | ICD-10-CM | POA: Insufficient documentation

## 2021-07-01 DIAGNOSIS — M6281 Muscle weakness (generalized): Secondary | ICD-10-CM | POA: Insufficient documentation

## 2021-07-01 DIAGNOSIS — G8929 Other chronic pain: Secondary | ICD-10-CM

## 2021-07-01 NOTE — Therapy (Signed)
[facility] [address] [place], [place], [address] Phone: [phone]   Fax:  [phone]  Physical Therapy Evaluation  Patient Details  Name: [name] MRN: [id number] Date of Birth: 03/01/1971 Referring Provider (PT): Maurice Small, MD   Encounter Date: 07/01/2021   PT End of Session - 07/01/21 1208     Visit Number 1    Date for PT Re-Evaluation 08/26/21    Authorization Type U[employer]    Authorization Time Period 10/20/20-09/3021    Authorization - Visit Number 1    Authorization - Number of Visits 19    PT Start Time 0803    PT Stop Time [id number]    PT Time Calculation (min) 44 min    Activity Tolerance Patient tolerated treatment well;Patient limited by fatigue    Behavior During Therapy [facility] for tasks assessed/performed             History reviewed. No pertinent past medical history.  Past Surgical History:  Procedure Laterality Date   ABDOMINAL HYSTERECTOMY     BREAST LUMPECTOMY Bilateral    HERNIA REPAIR     SPINAL FUSION     TONSILLECTOMY      There were no vitals filed for this visit.    Subjective Assessment - 07/01/21 0801     Subjective Pt presents s/p spinal fusion in 01/2021 through [facility] from T4-L4 to stabilize scoliosis which has been present since her teenage years.  Pt is working with cardiologist to balance Beta Blockers and meds to increase BP (due to effect of beta blockers).  Pt current level of exercise is 1.3 miles/day.  No PT since surgery.  Pt reports signif tighteness along Rt trunk and lower abdomen.  Pt had signif SOB but this is improving and HR has come way down since starting Beta Blocker.    Pertinent History 02/14/21 spinal fusion posterior thoracic T4-L4 performed by Dr Atilano Ina of [facility], working with cardiology and started Beta Blocker to address autonomic dysfunction for tachycardia and PVCs, ongoing Lt shoulder pain, Lt UE resting tremor since surgery    How  long can you walk comfortably? 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 Yes    Pain Score 8     Pain Location Back    Pain Orientation Right    Pain Descriptors / Indicators Tightness    Pain Type Surgical pain;Chronic pain    Pain Radiating Towards along Rt side of trunk, across lower abdomen and front of hips    Pain Onset More than a month ago    Pain Frequency Constant    Aggravating Factors  as day goes on    Effect of Pain on Daily Activities hasn't gone back to work, very deconditioned from lack of exercise and activity                [facility] PT Assessment - 07/01/21 0001       Assessment   Medical Diagnosis R29.898 (ICD-10-CM) - Other symptoms and signs involving the musculoskeletal system    Referring Provider (PT) Maurice Small, MD    Onset Date/Surgical Date 02/14/21    Hand Dominance Left    Next MD Visit working with various [facility] MDs, surgeon at [facility] left the practice    Prior Therapy not since surgery  Precautions   Precaution Comments spinal fusion T4-L4, Dr Vertell Limber says no restrictions at this point      Balance Screen   Has the patient fallen in the past 6 months No      [place] residence    Living Arrangements Spouse/significant other    Type of [place] Two level    Additional Comments Pt can't drive yet, and needs Fri appointments when husband can drive her, or [facility] transportation set up      Prior Function   Level of Independence Independent with basic ADLs;Other (comment);Independent with transfers   difficulty with donning/doffing Lt shoe/sock, husband cooks   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[employer], occupational, is a Radio broadcast assistant and wants to return to work in person      Observation/Other Assessments   Observations  central vertical scar along spine T4-L4 with poor scar mobilty especially lower half      Posture/Postural Control   Posture Comments rigid trunk posture, elevated Lt shoulder      ROM / Strength   AROM / PROM / Strength PROM;AROM;Strength      AROM   Overall AROM Comments UEs WFL bil      PROM   PROM Assessment Site Hip    Right/Left Hip Left    Left Hip Flexion 95    Left Hip External Rotation  65    Left Hip Internal Rotation  3    Left Hip ABduction 35      Strength   Overall Strength Comments UEs and LEs 4/5 bil      Flexibility   Soft Tissue Assessment /Muscle Length yes    Hamstrings lacks 15 deg bil      Palpation   Spinal mobility fusion T4-L4    Palpation comment signif myofascial tension lumbar, lateral pelvis and hips, abdomen, obliques      Transfers   Transfers Sit to Stand;Stand to Sit;Supine to Sit;Sit to Supine    Sit to Stand 6: Modified independent (Device/Increase time)   able to perform without UEs   Comments Ind with all transfers but slow      Ambulation/Gait   Ambulation/Gait Yes    Ambulation/Gait Assistance 7: Independent    Ambulation Distance (Feet) 480 Feet    Assistive device None    Gait Pattern Decreased arm swing - right;Decreased arm swing - left;Decreased step length - left;Decreased step length - right;Decreased trunk rotation    Gait Comments 3' walk test,78 bpm resting HR pre test, 94bpm end of test, BORG RPE level 12, min SOB      Standardized Balance Assessment   Standardized Balance Assessment Five Times Sit to Stand    Five times sit to stand comments  20 sec                        Objective measurements completed on examination: See above findings.                 PT Short Term Goals - 07/01/21 1224       PT SHORT TERM GOAL #1   Title Pt will learn self massage and assisted massage techniques (husband can help) for improved scar and fascial mobility.    Time 2    Period Weeks    Status New     Target Date 07/15/21  PT SHORT TERM GOAL #2   Title Pt will be able to don/doff Lt shoe and sock with at least 50% greater ease due to improved Lt hip mobility.    Time 4    Period Weeks    Status New    Target Date 07/29/21      PT SHORT TERM GOAL #3   Title Pt will be able to participate in aquatic and land-based appointments with min SOB throughout session    Time 4    Period Weeks    Status New    Target Date 07/29/21      PT SHORT TERM GOAL #4   Title Reduce 5x sit to stand to </= 17 sec    Baseline 20 sec    Time 4    Period Weeks    Status New    Target Date 07/29/21               PT Long Term Goals - 07/01/21 1227       PT LONG TERM GOAL #1   Title Pt will be able to return to work for at least half-days most days of the week without exacerbation of fatigue or pain    Time 8    Period Weeks    Status New    Target Date 08/26/21      PT LONG TERM GOAL #2   Title Pt will feel strong enough to drive short community distances to improve access to work, appointments and errands.    Time 8    Period Weeks    Status New    Target Date 08/26/21      PT LONG TERM GOAL #3   Title Pt will participate in 6' walk test covering at least 900' with min SOB and BORG RPE >/= 13 to demo improved endurance.    Baseline 3' walk test, BORG RPE 12, min SOB, covered 480', HR reached 94 bpm    Time 8    Period Weeks    Status New    Target Date 08/26/21      PT LONG TERM GOAL #4   Title Pt will be ind with advanced HEP and understand how to safely progress    Time 8    Period Weeks    Status New    Target Date 08/26/21      PT LONG TERM GOAL #5   Title Reduce 5x sit to stand to </= 13 sec to demo improved functional LE strength and endurance    Time 8    Period Weeks    Status New    Target Date 08/26/21                    Plan - 07/01/21 1210     Clinical Impression Statement Pt is a pleasant 50yo female referred to OPPT with global weakness  and deconditioning following spinal fusion T4-L4 performed 02/14/21 to stabilize scoliosis.  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  She is able to perform basic ADLs and transfers ind.  She ambulates without AD.  Since surgery she has experienced signif tightness along Rt side of trunk, bil anterior hips, and lower abdomen.  She notes abdominal bloating/bulging.  She has an intermittent Lt UE tremor.  She has autonomic dysregulation and is working with meds (beta blocker) through a cardiologist to control HR and rythym which has helped her SOB  and walking tolerance.  She is about to walk 1.3 miles daily.  She currently works 4 hours a day from home in a recliner but strongly desires to go back to work.  She presents with rigid trunk secondary to fusion with signif myofascial restrictions in thoracic, lumbar, abdomen and lateral pelvic regions.  Scar mobility is poor especially in the lumbar region.  She is unable to activate deep abdominals.  LE and UE strength is 4/5.  She has limited P/ROM in bil hips Lt>Rt in all planes and it is difficult to discern what first resistance is, perhaps fascial lines.  Pt's 5x sit to stand was 20 sec with light use of UEs.  3' walk test was well tolerated, ambulating 480' with min SOB and BORG RPE rating of 12.  Pt is an excellent candidate for aquatic and land-based PT to improve mobility of soft tissues, cardiovascular endurance, and functional mobility.    Personal Factors and Comorbidities Comorbidity 1;Time since onset of injury/illness/exacerbation;Transportation;Comorbidity 2    Comorbidities autonomic dysregulation, spinal fusion T4-L4    Examination-Activity Limitations Locomotion Level;Bed Mobility;Bend;Sit;Stand;Dressing    Examination-Participation Restrictions Driving;Community Activity;Cleaning;Meal Prep;Occupation    Stability/Clinical Decision Making Evolving/Moderate complexity    Clinical Decision Making  Moderate    Rehab Potential Excellent    PT Frequency 2x / week    PT Duration 8 weeks    PT Treatment/Interventions Aquatic Therapy;ADLs/Self Care Home Management;Electrical Stimulation;Cryotherapy;Moist Heat;Neuromuscular re-education;Balance training;Therapeutic exercise;Therapeutic activities;Functional mobility training;Gait training;Patient/family education;Manual techniques;Dry needling;Scar mobilization;Passive range of motion    PT Next Visit Plan myofascial release and scar mobs (instruct husband and pt on self-massage, intro suction cup fascial release), given TENS info, progress functional strength, endurance    PT Home Exercise Plan Access Code: [email or web address]    Recommended Other Services aquatic PT alongside land-based    Consulted and Agree with Plan of Care Patient             Patient will benefit from skilled therapeutic intervention in order to improve the following deficits and impairments:  Abnormal gait, Decreased range of motion, Increased fascial restricitons, Impaired tone, Postural dysfunction, Decreased strength, Decreased mobility, Impaired flexibility, Hypomobility, Decreased scar mobility, Pain, Decreased activity tolerance, Cardiopulmonary status limiting activity, Decreased endurance  Visit Diagnosis: Muscle weakness (generalized) - Plan: PT plan of care cert/re-cert  Other abnormalities of gait and mobility - Plan: PT plan of care cert/re-cert  Pain in thoracic spine - Plan: PT plan of care cert/re-cert  Chronic bilateral low back pain without sciatica - Plan: PT plan of care cert/re-cert     Problem List Patient Active Problem List   Diagnosis Date Noted   Scoliosis (and kyphoscoliosis), idiopathic 02/06/2012    Baruch Merl, PT 07/01/21 12:34 PM   [facility] [address] [place], [place], [address] Phone: [phone]   Fax:  [phone]  Name: [name] MRN:  [id number] Date of Birth: 31-May-1971

## 2021-07-01 NOTE — Patient Instructions (Signed)
Access Code: [email or web address] URL: [email or web address] Date: 07/01/2021 Prepared by: Venetia Night Lakara Weiland  Exercises Supine Figure 4 Piriformis Stretch - 1 x daily - 7 x weekly - 1 sets - 3 reps - 20 hold Hooklying Single Knee to Chest Stretch - 1 x daily - 7 x weekly - 1 sets - 3 reps - 20 hold Supine Piriformis Stretch Pulling Heel to Hip - 1 x daily - 7 x weekly - 1 sets - 3 reps - 20 hold      [facility] Welcome to [facility]! Here you will find all the information you will need regarding your pool therapy. If you have further questions at any time, please call our office at [phone]. After completing your initial evaluation in the [place] clinic, you may be eligible to complete a portion of your therapy in the pool. A typical week of therapy will consist of 1-2 typical physical therapy visits at our [place] location and an additional session of therapy in the pool located at the [facility] at [facility]. [address] [place]. The phone number at the pool site is [phone]. Please call this number if you are running late or need to cancel your appointment.  Aquatic therapy will be offered on Friday afternoons. Each session will last approximately 45 minutes. All scheduling and payments for aquatic therapy sessions, including cancelations, will be done through our [place] location.  To be eligible for aquatic therapy, these criteria must be met: You must be able to independently change in the locker room and get to the pool deck. A caregiver can come with you to help if needed. There are bleachers for a caregiver to sit on next to the pool. No one with an open wound is permitted in the pool.  Handicap parking is available in the front and there is a drop off option for even closer accessibility. Please arrive 15 minutes prior to your appointment to prepare for your pool session. You must sign in  at the front desk upon your arrival. Please be sure to attend to any toileting needs prior to entering the pool. [facility] rooms for changing are located to the right of the check-in desk. There is direct access to the pool deck from the locker room. You can lock your belongings in a locker but must bring a lock. Your therapist will greet you on the pool deck. There may be other swimmers in the pool at the same time but your session is one-on-one with the therapist.

## 2021-07-06 IMAGING — CT CT ABD-PELV W/ CM
2 of 4 series · 15 of 46 positions shown, 17 images · IV contrast (omnipaque)
Comparison: Chest CT dated 10/10/2005.

CLINICAL DATA: 50-year-old male with abdominal distension.

EXAM:
CT ANGIOGRAPHY CHEST
CT ABDOMEN AND PELVIS WITH CONTRAST
TECHNIQUE: Multidetector CT imaging of the chest was performed using the
standard protocol during bolus administration of intravenous
contrast. Multiplanar CT image reconstructions and MIPs were
obtained to evaluate the vascular anatomy. Multidetector CT imaging
of the abdomen and pelvis was performed using the standard protocol
during bolus administration of intravenous contrast.
CONTRAST:  100mL OMNIPAQUE IOHEXOL 350 MG/ML SOLN

[Series 3: a/p w/ 5mm · axial · 0.84mm/px · z∈[+889,+1289]mm · 12 of 92 slices shown, 14 images]
[im 8/92  soft-tissue]
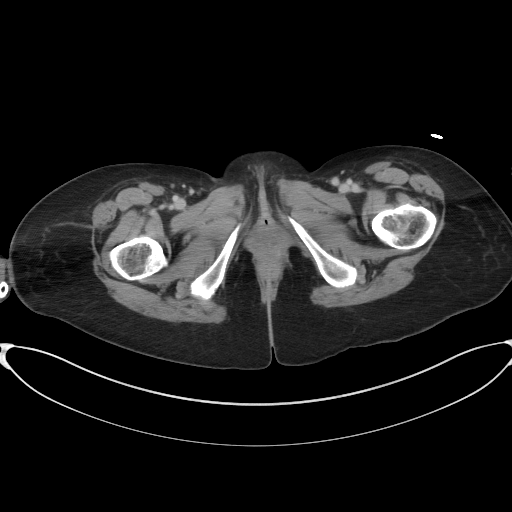
[im 8/92  bone]
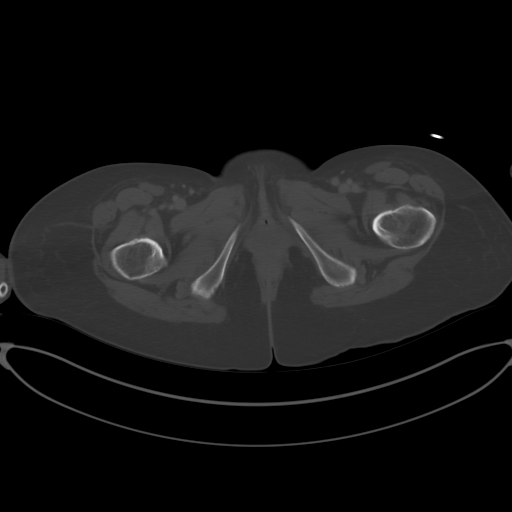
[im 15/92  soft-tissue]
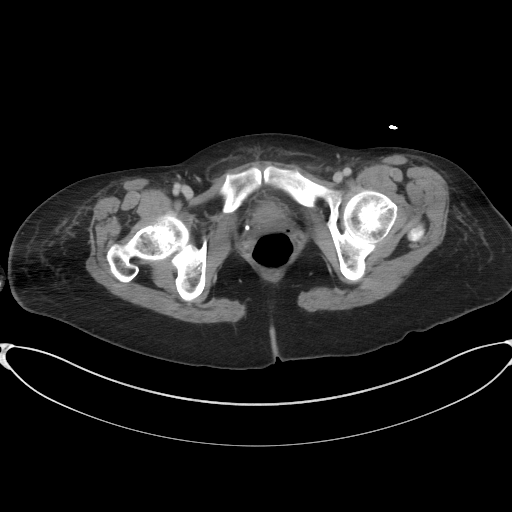
[im 22/92  soft-tissue]
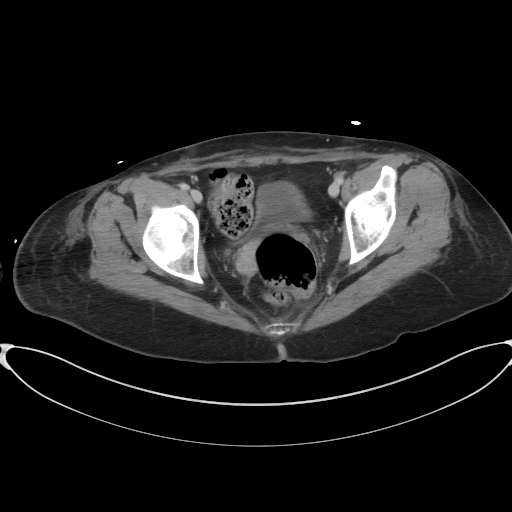
[im 30/92  soft-tissue]
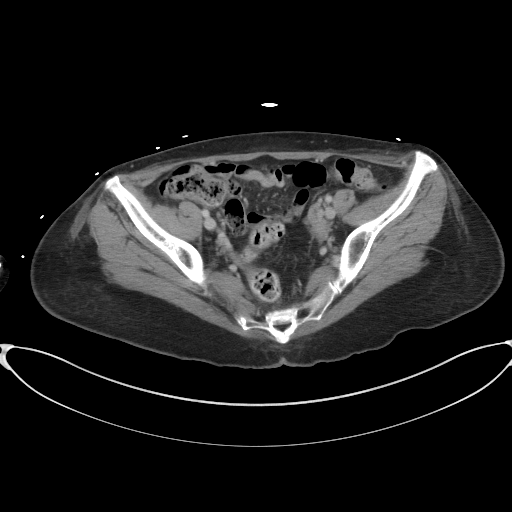
[im 37/92  soft-tissue]
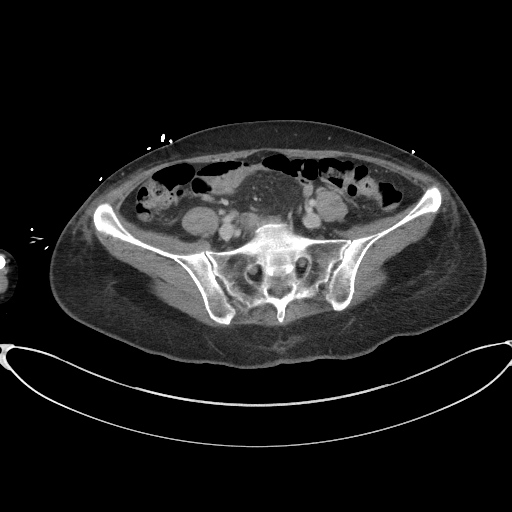
[im 44/92  soft-tissue]
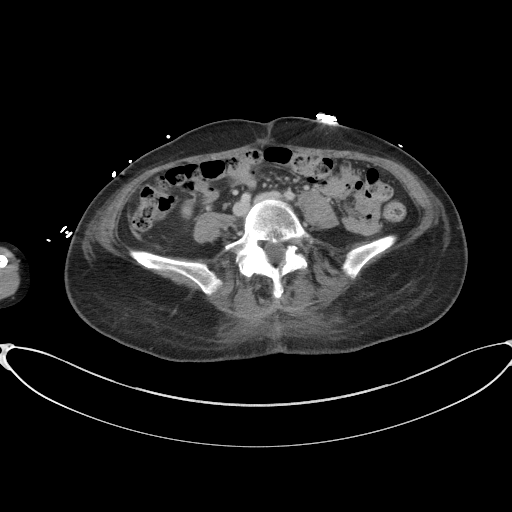
[im 51/92  soft-tissue]
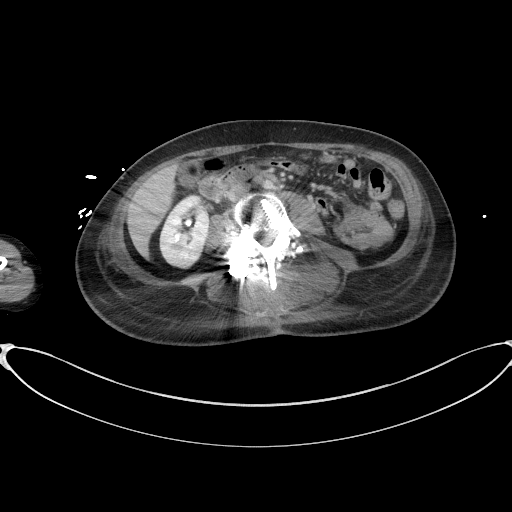
[im 59/92  soft-tissue]
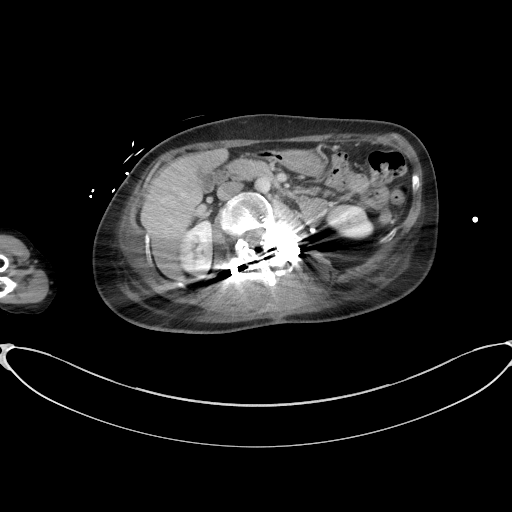
[im 66/92  soft-tissue]
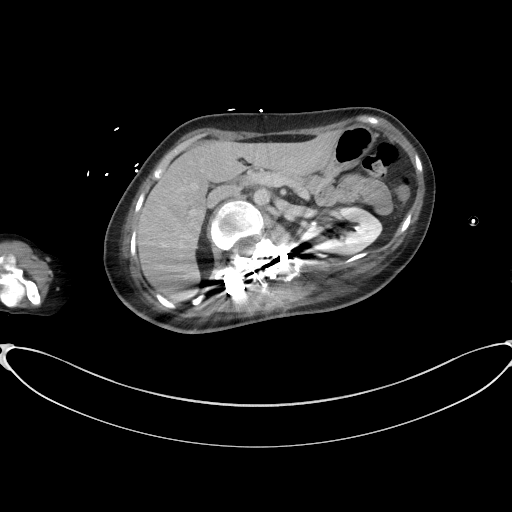
[im 66/92  bone]
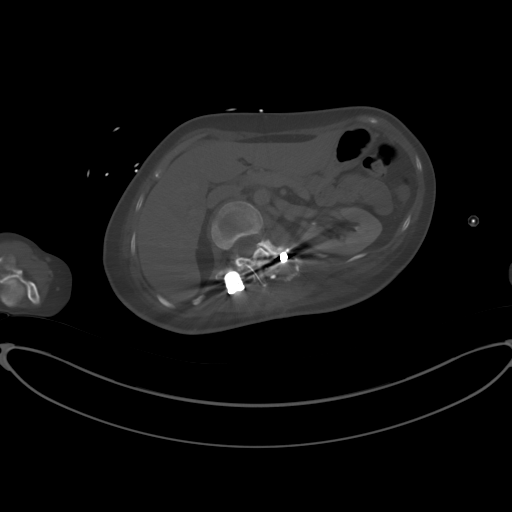
[im 73/92  soft-tissue]
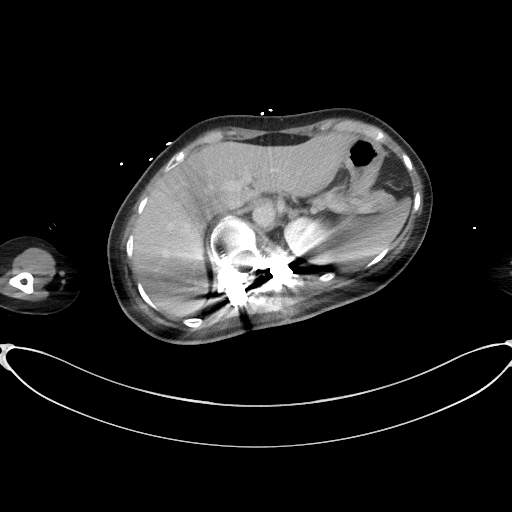
[im 81/92  soft-tissue]
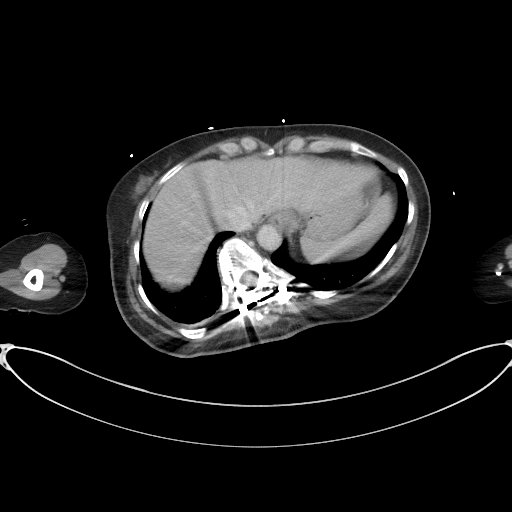
[im 88/92  soft-tissue]
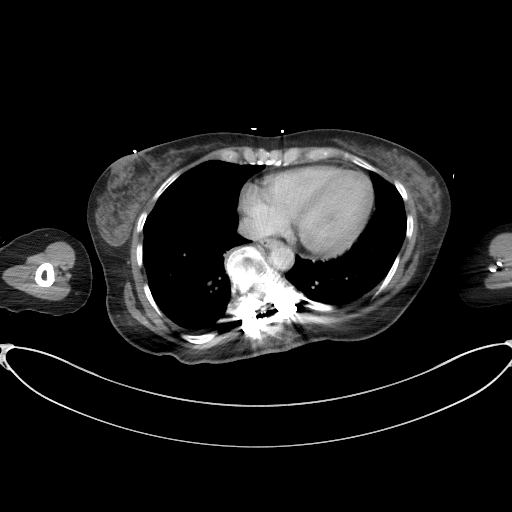

[Series 6: a/p w/ cor · coronal · 0.76mm/px · 3 of 109 slices shown]
[im 37/109  soft-tissue]
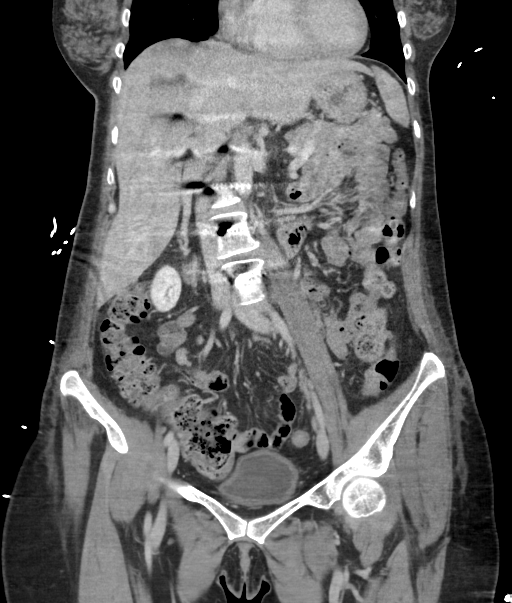
[im 49/109  soft-tissue]
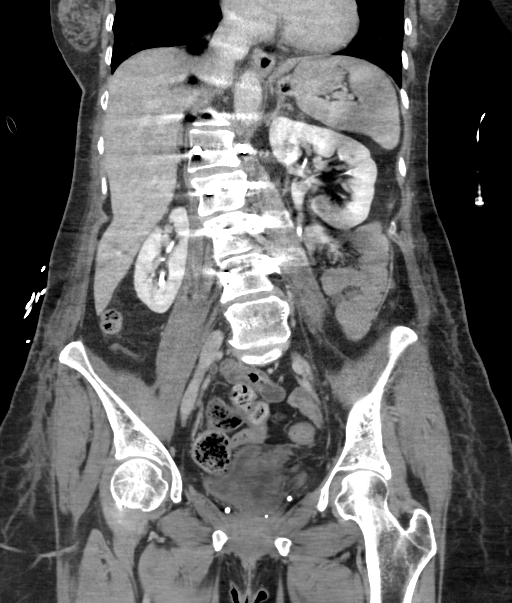
[im 61/109  soft-tissue]
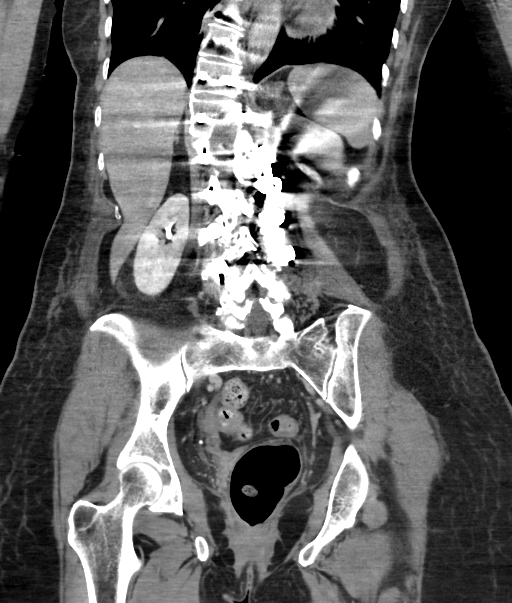

[15 of 46 positions shown; findings below may reference images not displayed]

FINDINGS: Evaluation is limited due to streak artifact caused by spinal fusion
hardware.

CTA CHEST FINDINGS

Cardiovascular: There is no cardiomegaly or pericardial effusion.
The thoracic aorta is grossly unremarkable. No pulmonary artery
embolus identified.

Mediastinum/Nodes: There is no hilar or mediastinal adenopathy. The
esophagus is grossly unremarkable. No mediastinal fluid collection.

Lungs/Pleura: Minimal bibasilar dependent atelectasis. No focal
consolidation, pleural effusion, or pneumothorax. A cluster of
nodular density in the right lower lobe (108/6) likely inflammatory
or infectious in etiology. Clinical correlation and follow-up
recommended. The central airways are patent.

Musculoskeletal: Scoliosis and posterior fusion hardware. No acute
osseous pathology.

Review of the MIP images confirms the above findings.

CT ABDOMEN and PELVIS FINDINGS

No intra-abdominal free air.  Small free fluid in the pelvis.

Hepatobiliary: No focal liver abnormality is seen. No gallstones,
gallbladder wall thickening, or biliary dilatation.

Pancreas: Unremarkable. No pancreatic ductal dilatation or
surrounding inflammatory changes.

Spleen: Normal in size without focal abnormality.

Adrenals/Urinary Tract: The adrenal glands unremarkable. There is no
hydronephrosis on either side. There is symmetric enhancement and
excretion of contrast by both kidneys. The visualized ureters and
urinary bladder appear unremarkable.

Stomach/Bowel: There is no bowel obstruction or active inflammation.
The appendix is not visualized with certainty. No inflammatory
changes identified in the right lower quadrant.

Vascular/Lymphatic: The abdominal aorta and IVC unremarkable. No
portal venous gas. There is no adenopathy.

Reproductive: Hysterectomy.  No adnexal masses.

Other: None

Musculoskeletal: Scoliosis and posterior spinal fusion hardware. No
acute osseous pathology.

Review of the MIP images confirms the above findings.
IMPRESSION: 1. A cluster of nodular density in the right lower lobe suspicious
for an inflammatory/infectious process. Clinical correlation and
follow-up recommended. No CT evidence of pulmonary embolism.
2. No acute intra-abdominal, or pelvic pathology.

## 2021-07-11 ENCOUNTER — Encounter: Payer: Self-pay | Admitting: Physical Therapy

## 2021-07-11 ENCOUNTER — Other Ambulatory Visit: Payer: Self-pay

## 2021-07-11 ENCOUNTER — Ambulatory Visit: Payer: 59 | Admitting: Physical Therapy

## 2021-07-11 DIAGNOSIS — M6281 Muscle weakness (generalized): Secondary | ICD-10-CM | POA: Diagnosis not present

## 2021-07-11 DIAGNOSIS — G8929 Other chronic pain: Secondary | ICD-10-CM

## 2021-07-11 DIAGNOSIS — R2689 Other abnormalities of gait and mobility: Secondary | ICD-10-CM

## 2021-07-11 DIAGNOSIS — M546 Pain in thoracic spine: Secondary | ICD-10-CM

## 2021-07-11 NOTE — Therapy (Signed)
[facility] [address] [place], [place], [address] Phone: [phone]   Fax:  [phone]  Physical Therapy Treatment  Patient Details  Name: [name] MRN: [id number] Date of Birth: August 07, 1971 Referring Provider (PT): Maurice Small, MD   Encounter Date: 07/11/2021   PT End of Session - 07/11/21 0851     Visit Number 2    Date for PT Re-Evaluation 08/26/21    Authorization Type UHC    Authorization Time Period 10/20/20-09/3021    Authorization - Visit Number 2    Authorization - Number of Visits 19    PT Start Time 0802    PT Stop Time 0846    PT Time Calculation (min) 44 min    Activity Tolerance Patient tolerated treatment well;Patient limited by fatigue    Behavior During Therapy [facility] for tasks assessed/performed             History reviewed. No pertinent past medical history.  Past Surgical History:  Procedure Laterality Date   ABDOMINAL HYSTERECTOMY     BREAST LUMPECTOMY Bilateral    HERNIA REPAIR     SPINAL FUSION     TONSILLECTOMY      There were no vitals filed for this visit.   Subjective Assessment - 07/11/21 0807     Subjective Pt is doing the stretches and may feel like her Lt hip is moving better.  I have steel band tightness across my low back and stomach.    Pertinent History 02/14/21 spinal fusion posterior thoracic T4-L4 performed by Dr Atilano Ina of [facility], working with cardiology and started Beta Blocker to address autonomic dysfunction for tachycardia and PVCs, ongoing Lt shoulder pain, Lt UE resting tremor since surgery    How long can you walk comfortably? 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 Yes    Pain Score 10-Worst pain ever    Pain Location Back    Pain Orientation Right;Left    Pain Descriptors / Indicators Tightness    Pain Type  Surgical pain;Chronic pain    Pain Radiating Towards abdominal wall                               [facility] Adult PT Treatment/Exercise - 07/11/21 0001       Self-Care   Self-Care Other Self-Care Comments    Other Self-Care Comments  introduced scar massage to husband to perform to lower 1/3 of scar daily      Neuro Re-ed    Neuro Re-ed Details  supine reclined diaphragmatic breathing instruction, improved lateral costal expansion with TC and VC      Exercises   Exercises Lumbar;Knee/Hip;Shoulder      Lumbar Exercises: Stretches   Other Lumbar Stretch Exercise reclined shoulder flexion with single leg reach for trunk elongation 2x10 sec each side      Lumbar Exercises: Aerobic   Nustep L1 x 3.5 min, PT present to discuss symptoms      Lumbar Exercises: Seated   Long Arc Quad on Chair AROM;Both;1 set;5 reps    Sit to Stand 5 reps      Knee/Hip Exercises: Stretches   Hip Flexor Stretch Both;2 reps;10 seconds    Hip Flexor Stretch Limitations foot on 2nd step  Knee/Hip Exercises: Seated   Clamshell with TheraBand Green   5 reps     Shoulder Exercises: Seated   Extension Strengthening;Both;Theraband;5 reps    Theraband Level (Shoulder Extension) Level 1 (Yellow)    Row Strengthening;Both;5 reps;Theraband    Theraband Level (Shoulder Row) Level 1 (Yellow)    Row Limitations high to low      Manual Therapy   Manual Therapy Soft tissue mobilization;Myofascial release    Soft tissue mobilization scar massage lower 1/3 of scar along lumbar and lower thoracic spine where scar most restricted    Myofascial Release with suction cup dragging from posterior ribcage to anterior, bil                      PT Short Term Goals - 07/11/21 0858       PT SHORT TERM GOAL #1   Title Pt will learn self massage and assisted massage techniques (husband can help) for improved scar and fascial mobility.    Baseline instructed in scar massage    Status  On-going      PT SHORT TERM GOAL #2   Title Pt will be able to don/doff Lt shoe and sock with at least 50% greater ease due to improved Lt hip mobility.    Status On-going               PT Long Term Goals - 07/01/21 1227       PT LONG TERM GOAL #1   Title Pt will be able to return to work for at least half-days most days of the week without exacerbation of fatigue or pain    Time 8    Period Weeks    Status New    Target Date 08/26/21      PT LONG TERM GOAL #2   Title Pt will feel strong enough to drive short community distances to improve access to work, appointments and errands.    Time 8    Period Weeks    Status New    Target Date 08/26/21      PT LONG TERM GOAL #3   Title Pt will participate in 6' walk test covering at least 900' with min SOB and BORG RPE >/= 13 to demo improved endurance.    Baseline 3' walk test, BORG RPE 12, min SOB, covered 480', HR reached 94 bpm    Time 8    Period Weeks    Status New    Target Date 08/26/21      PT LONG TERM GOAL #4   Title Pt will be ind with advanced HEP and understand how to safely progress    Time 8    Period Weeks    Status New    Target Date 08/26/21      PT LONG TERM GOAL #5   Title Reduce 5x sit to stand to </= 13 sec to demo improved functional LE strength and endurance    Time 8    Period Weeks    Status New    Target Date 08/26/21                   Plan - 07/11/21 [id number]     Clinical Impression Statement Pt returns for first follow up since initial visit having been compliant with hip stretches and finding marginal improvement in Lt hip ROM for donning sock and shoe.  Pt's husband attended session and was instructed in scar massage to improve mobility  within lumbar region and lower thoracic region.  Pt instructed in diaphragmatic breathing in efforts to improve ribcage mobility and release oblique tone with good response today.  She had burning discomfort consistent with fascial tightness with  suction cup use to release oblique fascia from posterior to anterior aspects of ribcage bil.  PT instructed in dynamic stretching for trunk elongation with overhead reach and long leg reach as well as foot on stairs for hip mobility and stretching of anterior hip line.  Pt reported greater ease of breathing mechanics and reduced tension end of session suggesting good tolerance to techniques.    Comorbidities autonomic dysregulation, spinal fusion T4-L4    Rehab Potential Excellent    PT Frequency 2x / week    PT Duration 8 weeks    PT Treatment/Interventions Aquatic Therapy;ADLs/Self Care Home Management;Electrical Stimulation;Cryotherapy;Moist Heat;Neuromuscular re-education;Balance training;Therapeutic exercise;Therapeutic activities;Functional mobility training;Gait training;Patient/family education;Manual techniques;Dry needling;Scar mobilization;Passive range of motion    PT Next Visit Plan aquatic PT next for general mobility and endurance, continue fascial release, trunk elongation and hip mobility, f/u on scar massage w/ husband assist, f/u on HEP    PT Home Exercise Plan Access Code: [email or web address]    Consulted and Agree with Plan of Care Patient             Patient will benefit from skilled therapeutic intervention in order to improve the following deficits and impairments:     Visit Diagnosis: Muscle weakness (generalized)  Other abnormalities of gait and mobility  Pain in thoracic spine  Chronic bilateral low back pain without sciatica     Problem List Patient Active Problem List   Diagnosis Date Noted   Scoliosis (and kyphoscoliosis), idiopathic 02/06/2012    Baruch Merl, PT 07/11/21 8:59 AM   [facility] [address] [place], [place], [address] Phone: [phone]   Fax:  [phone]  Name: [name] MRN: [id number] Date of Birth: 05-16-1971

## 2021-07-11 NOTE — Patient Instructions (Signed)
Access Code: [email or web address] URL: [email or web address] Date: 07/11/2021 Prepared by: Venetia Night Arriah Wadle  Exercises Supine Figure 4 Piriformis Stretch - 1 x daily - 7 x weekly - 1 sets - 3 reps - 20 hold Hooklying Single Knee to Chest Stretch - 1 x daily - 7 x weekly - 1 sets - 3 reps - 20 hold Supine Piriformis Stretch Pulling Heel to Hip - 1 x daily - 7 x weekly - 1 sets - 3 reps - 20 hold Sit to Stand - 1 x daily - 7 x weekly - 2 sets - 10 reps Seated Long Arc Quad - 1 x daily - 7 x weekly - 2 sets - 10 reps Seated Hip Abduction with Resistance - 1 x daily - 7 x weekly - 2 sets - 10 reps Seated High Shoulder Row with Anchored Resistance - 1 x daily - 7 x weekly - 2 sets - 10 reps Seated Shoulder Extension and Scapular Retraction with Resistance - 1 x daily - 7 x weekly - 2 sets - 10 reps Standing Plank on Wall - 1 x daily - 7 x weekly - 1 sets - 5 reps - 10 hold

## 2021-07-15 ENCOUNTER — Encounter: Payer: Self-pay | Admitting: Physical Therapy

## 2021-07-15 ENCOUNTER — Ambulatory Visit: Payer: 59 | Admitting: Physical Therapy

## 2021-07-15 ENCOUNTER — Other Ambulatory Visit: Payer: Self-pay

## 2021-07-15 DIAGNOSIS — R2689 Other abnormalities of gait and mobility: Secondary | ICD-10-CM

## 2021-07-15 DIAGNOSIS — G8929 Other chronic pain: Secondary | ICD-10-CM

## 2021-07-15 DIAGNOSIS — M546 Pain in thoracic spine: Secondary | ICD-10-CM

## 2021-07-15 DIAGNOSIS — M6281 Muscle weakness (generalized): Secondary | ICD-10-CM

## 2021-07-15 DIAGNOSIS — M545 Low back pain, unspecified: Secondary | ICD-10-CM

## 2021-07-15 NOTE — Therapy (Signed)
[facility] [address], [place], [place], [address] Phone: [phone]   Fax:  [phone]  Physical Therapy Treatment  Patient Details  Name: Shardea Richmond MRN: [id number] Date of Birth: 1971-07-08 Referring Provider (PT): Maurice Small, MD   Encounter Date: 07/15/2021   PT End of Session - 07/15/21 1545     Visit Number 4    Date for PT Re-Evaluation 08/26/21    Authorization Type UHC    Authorization Time Period 10/20/20-09/3021    Authorization - Visit Number 4    Authorization - Number of Visits 19    PT Start Time 1430    PT Stop Time 1510    PT Time Calculation (min) 40 min    Activity Tolerance Patient tolerated treatment well    Behavior During Therapy [facility] for tasks assessed/performed             History reviewed. No pertinent past medical history.  Past Surgical History:  Procedure Laterality Date   ABDOMINAL HYSTERECTOMY     BREAST LUMPECTOMY Bilateral    HERNIA REPAIR     SPINAL FUSION     TONSILLECTOMY      There were no vitals filed for this visit.   Subjective Assessment - 07/15/21 1544     Subjective I have no pain just the tightness.    Patient is accompained by: Family member    Pertinent History 02/14/21 spinal fusion posterior thoracic T4-L4 performed by Dr Atilano Ina of [facility], working with cardiology and started Beta Blocker to address autonomic dysfunction for tachycardia and PVCs, ongoing Lt shoulder pain, Lt UE resting tremor since surgery    Currently in Pain? No/denies             Treatment: Patient seen for aquatic therapy today.  Treatment took place in water 2.5-4 feet deep depending upon activity.  Pt entered the pool via stairs, reciprocally, mild use of railings, water temp 94 degreesF.  Seated water bench with 75% submersion Pt performed seated LE AROM exercises 20x in all planes, concurrent pain assessment, discussion of current status, verbal ed  on water principles And how we would use them. Standing 75% submersion: Water walking with small noodle 4 lengths in each direction. PTA constantly monitoring vitals. No adverse reactions. Gentle RT hip 4 3 way AROM movements 10x each.  Horizontal decompression with 100% flotation and PTA providing lateral sway to mobilize trunk.                          PT Education - 07/15/21 1544     Education Details Water principles    Person(s) Educated Patient    Methods Explanation    Comprehension Verbalized understanding              PT Short Term Goals - 07/11/21 0858       PT SHORT TERM GOAL #1   Title Pt will learn self massage and assisted massage techniques (husband can help) for improved scar and fascial mobility.    Baseline instructed in scar massage    Status On-going      PT SHORT TERM GOAL #2   Title Pt will be able to don/doff Lt shoe and sock with at least 50% greater ease due to improved Lt hip mobility.    Status On-going               PT Long Term Goals - 07/01/21 1227  PT LONG TERM GOAL #1   Title Pt will be able to return to work for at least half-days most days of the week without exacerbation of fatigue or pain    Time 8    Period Weeks    Status New    Target Date 08/26/21      PT LONG TERM GOAL #2   Title Pt will feel strong enough to drive short community distances to improve access to work, appointments and errands.    Time 8    Period Weeks    Status New    Target Date 08/26/21      PT LONG TERM GOAL #3   Title Pt will participate in 6' walk test covering at least 900' with min SOB and BORG RPE >/= 13 to demo improved endurance.    Baseline 3' walk test, BORG RPE 12, min SOB, covered 480', HR reached 94 bpm    Time 8    Period Weeks    Status New    Target Date 08/26/21      PT LONG TERM GOAL #4   Title Pt will be ind with advanced HEP and understand how to safely progress    Time 8    Period Weeks     Status New    Target Date 08/26/21      PT LONG TERM GOAL #5   Title Reduce 5x sit to stand to </= 13 sec to demo improved functional LE strength and endurance    Time 8    Period Weeks    Status New    Target Date 08/26/21                   Plan - 07/15/21 1546     Clinical Impression Statement Pt arrives with no pain. Compliance with home stretche, fascia massage. Pt was educated in water principles and how we would be using them today and going forward. Pt reports "feeling" the RT hip tightness with todays exercises but appears to tolerate them ok. Pt had no adverse vital signs while in the water: no SOB, dizziness, etc..otherwise pt performed all movements gently and with good control.    Personal Factors and Comorbidities Comorbidity 1;Time since onset of injury/illness/exacerbation;Transportation;Comorbidity 2    Comorbidities autonomic dysregulation, spinal fusion T4-L4    Examination-Activity Limitations Locomotion Level;Bed Mobility;Bend;Sit;Stand;Dressing    Examination-Participation Restrictions Driving;Community Activity;Cleaning;Meal Prep;Occupation    Stability/Clinical Decision Making Evolving/Moderate complexity    PT Treatment/Interventions Aquatic Therapy;ADLs/Self Care Home Management;Electrical Stimulation;Cryotherapy;Moist Heat;Neuromuscular re-education;Balance training;Therapeutic exercise;Therapeutic activities;Functional mobility training;Gait training;Patient/family education;Manual techniques;Dry needling;Scar mobilization;Passive range of motion    PT Next Visit Plan aquatic PT next for general mobility and endurance, continue fascial release, trunk elongation and hip mobility, f/u on scar massage w/ husband assist, f/u on HEP    PT Home Exercise Plan Access Code: [email or web address]    Consulted and Agree with Plan of Care Patient             Patient will benefit from skilled therapeutic intervention in order to improve the following deficits and impairments:      Visit Diagnosis: Muscle weakness (generalized)  Other abnormalities of gait and mobility  Pain in thoracic spine  Chronic bilateral low back pain without sciatica     Problem List Patient Active Problem List   Diagnosis Date Noted   Scoliosis (and kyphoscoliosis), idiopathic 02/06/2012    Yusif Gnau, PTA 07/15/2021, 3:50 PM  [facility] [address],  [place], [place], [address] Phone: [phone]   Fax:  [phone]  Name: Monicka Asleson MRN: [id number] Date of Birth: [birth year]

## 2021-07-18 ENCOUNTER — Other Ambulatory Visit: Payer: Self-pay

## 2021-07-18 ENCOUNTER — Ambulatory Visit: Payer: 59 | Admitting: Physical Therapy

## 2021-07-18 ENCOUNTER — Encounter: Payer: Self-pay | Admitting: Physical Therapy

## 2021-07-18 DIAGNOSIS — R2689 Other abnormalities of gait and mobility: Secondary | ICD-10-CM

## 2021-07-18 DIAGNOSIS — M546 Pain in thoracic spine: Secondary | ICD-10-CM

## 2021-07-18 DIAGNOSIS — M6281 Muscle weakness (generalized): Secondary | ICD-10-CM

## 2021-07-18 DIAGNOSIS — M545 Low back pain, unspecified: Secondary | ICD-10-CM

## 2021-07-18 DIAGNOSIS — G8929 Other chronic pain: Secondary | ICD-10-CM

## 2021-07-18 NOTE — Therapy (Signed)
[facility] [address], [place], [place], [address] Phone: [phone]   Fax:  [phone]  Physical Therapy Treatment  Patient Details  Name: [name] MRN: [id number] Date of Birth: Sep 04, 1971 Referring Provider (PT): Maurice Small, MD   Encounter Date: 07/18/2021   PT End of Session - 07/18/21 0757     Visit Number 4   miscount last treatment   Date for PT Re-Evaluation 08/26/21    Authorization Type UHC    Authorization Time Period 10/20/20-09/3021    Authorization - Visit Number 4    Authorization - Number of Visits 19    PT Start Time 0800    PT Stop Time 0842    PT Time Calculation (min) 42 min    Activity Tolerance Patient tolerated treatment well    Behavior During Therapy [facility] for tasks assessed/performed             History reviewed. No pertinent past medical history.  Past Surgical History:  Procedure Laterality Date   ABDOMINAL HYSTERECTOMY     BREAST LUMPECTOMY Bilateral    HERNIA REPAIR     SPINAL FUSION     TONSILLECTOMY      There were no vitals filed for this visit.   Subjective Assessment - 07/18/21 0757     Subjective I was exhausted after the pool.  I needed to rest x 3 hours afterwards.  We are doing the HEP and scar massage daily.  I have tried working a 1/2 day last week and it nearly killed me.    Pertinent History 02/14/21 spinal fusion posterior thoracic T4-L4 performed by Dr Atilano Ina of [facility], working with cardiology and started Beta Blocker to address autonomic dysfunction for tachycardia and PVCs, ongoing Lt shoulder pain, Lt UE resting tremor since surgery    How long can you walk comfortably? 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 No/denies   tight   Pain Descriptors / Indicators Tightness    Pain Type Surgical pain;Chronic pain     Aggravating Factors  as day goes on                               [facility] Adult PT Treatment/Exercise - 07/18/21 0001       Exercises   Exercises Lumbar;Knee/Hip;Shoulder      Lumbar Exercises: Aerobic   Nustep L2 x 4' PT present to discuss status      Knee/Hip Exercises: Stretches   Other Knee/Hip Stretches semi-reclined butterfly stretch (needs prop under left knee), x 20 sec      Knee/Hip Exercises: Standing   Hip Abduction Stengthening;AROM;Both;3 sets;5 reps;Knee straight    Abduction Limitations counter    Other Standing Knee Exercises hip opener and closer at counter, single UE support, Lt and Rt 1x5 - catch in Lt hip noted    Other Standing Knee Exercises march with single UE support 1x20      Shoulder Exercises: Stretch   Wall Stretch - Flexion 3 reps;10 seconds    Table Stretch - Flexion 3 reps;10 seconds      Manual Therapy   Manual Therapy Passive ROM;Soft tissue mobilization;Manual Traction    Manual therapy comments instruction in use of tennis ball for Lt glut release  with active repeated ER of Lt hip in semi-reclined position    Soft tissue mobilization Lt hip flexors, proximal quads, glut min, glut med in semi-reclined position propped with pillows to support spine    Passive ROM Lt hip flexion, ER, flexion with ER as tol    Manual Traction Lt hip Gr II 3x10 sec between P/ROM of Lt hip                      PT Short Term Goals - 07/18/21 1258       PT SHORT TERM GOAL #1   Title Pt will learn self massage and assisted massage techniques (husband can help) for improved scar and fascial mobility.    Status Achieved      PT SHORT TERM GOAL #2   Title Pt will be able to don/doff Lt shoe and sock with at least 50% greater ease due to improved Lt hip mobility.    Status On-going      PT SHORT TERM GOAL #3   Title Pt will be able to participate in aquatic and land-based appointments with min SOB throughout session    Status  Achieved      PT SHORT TERM GOAL #4   Title Reduce 5x sit to stand to </= 17 sec    Status New               PT Long Term Goals - 07/01/21 1227       PT LONG TERM GOAL #1   Title Pt will be able to return to work for at least half-days most days of the week without exacerbation of fatigue or pain    Time 8    Period Weeks    Status New    Target Date 08/26/21      PT LONG TERM GOAL #2   Title Pt will feel strong enough to drive short community distances to improve access to work, appointments and errands.    Time 8    Period Weeks    Status New    Target Date 08/26/21      PT LONG TERM GOAL #3   Title Pt will participate in 6' walk test covering at least 900' with min SOB and BORG RPE >/= 13 to demo improved endurance.    Baseline 3' walk test, BORG RPE 12, min SOB, covered 480', HR reached 94 bpm    Time 8    Period Weeks    Status New    Target Date 08/26/21      PT LONG TERM GOAL #4   Title Pt will be ind with advanced HEP and understand how to safely progress    Time 8    Period Weeks    Status New    Target Date 08/26/21      PT LONG TERM GOAL #5   Title Reduce 5x sit to stand to </= 13 sec to demo improved functional LE strength and endurance    Time 8    Period Weeks    Status New    Target Date 08/26/21                   Plan - 07/18/21 1252     Clinical Impression Statement Pt reported signif fatigue x 3 hours after pool session but did describe it as a good experience.  She is very motivated to work through symptoms if it will help.  PT progressed HEP to  include standing trunk and hip soft tissue stretches by way of UE wall slides and counter shoulder flexion walk backs to "L" today with good tolerance.  PT also added standing hip flexion and abd at counter to HEP.  Pt continues to have limited Lt hip mobility limiting ability to don/doff shoe and sock without signif UE assist of Lt LE.  PT targeted Lt hip P/ROM, STM and stretching to work  toward greater ROM for ADLs.  Pt continues to have signif intermittent tremors of all 4 extremities.  She trialed a 1/2 day return to work last week which she reports was extremely challenging but she does intend to try again this week on Wed.  She notes her meds that help her SOB tend to wear off in effectiveness by 2pm.  Pt will continue to benefit from skilled PT with ongoing monitoring and assessment of response to treatment.    Comorbidities autonomic dysregulation, spinal fusion T4-L4    PT Frequency 2x / week    PT Duration 8 weeks    PT Treatment/Interventions Aquatic Therapy;ADLs/Self Care Home Management;Electrical Stimulation;Cryotherapy;Moist Heat;Neuromuscular re-education;Balance training;Therapeutic exercise;Therapeutic activities;Functional mobility training;Gait training;Patient/family education;Manual techniques;Dry needling;Scar mobilization;Passive range of motion    PT Next Visit Plan aquatic PT and land based PT for general mobility, endurance, fascial release, hip ROM, LE flexibility, exercise progression    PT Home Exercise Plan Access Code: [email or web address]    Consulted and Agree with Plan of Care Patient             Patient will benefit from skilled therapeutic intervention in order to improve the following deficits and impairments:     Visit Diagnosis: Muscle weakness (generalized)  Other abnormalities of gait and mobility  Pain in thoracic spine  Chronic bilateral low back pain without sciatica     Problem List Patient Active Problem List   Diagnosis Date Noted   Scoliosis (and kyphoscoliosis), idiopathic 02/06/2012    Baruch Merl, PT 07/18/21 1:01 PM   [facility] [address], [place], [place], [address] Phone: [phone]   Fax:  [phone]  Name: [name] MRN: [id number] Date of Birth: 1971/03/08

## 2021-07-22 ENCOUNTER — Ambulatory Visit: Payer: 59 | Attending: Family Medicine | Admitting: Physical Therapy

## 2021-07-22 ENCOUNTER — Other Ambulatory Visit: Payer: Self-pay

## 2021-07-22 ENCOUNTER — Encounter: Payer: Self-pay | Admitting: Physical Therapy

## 2021-07-22 DIAGNOSIS — M546 Pain in thoracic spine: Secondary | ICD-10-CM

## 2021-07-22 DIAGNOSIS — R2689 Other abnormalities of gait and mobility: Secondary | ICD-10-CM

## 2021-07-22 DIAGNOSIS — G8929 Other chronic pain: Secondary | ICD-10-CM | POA: Diagnosis present

## 2021-07-22 DIAGNOSIS — M6281 Muscle weakness (generalized): Secondary | ICD-10-CM

## 2021-07-22 DIAGNOSIS — M545 Low back pain, unspecified: Secondary | ICD-10-CM | POA: Diagnosis present

## 2021-07-22 NOTE — Therapy (Signed)
[facility] [address] [place], [place], [address] Phone: [phone]   Fax:  [phone]  Physical Therapy Treatment  Patient Details  Name: [name] MRN: [id number] Date of Birth: 08/27/1971 Referring Provider (PT): Maurice Small, MD   Encounter Date: 07/22/2021   PT End of Session - 07/22/21 1559     Visit Number 5    Date for PT Re-Evaluation 08/26/21    Authorization Type UHC    Authorization Time Period 10/20/20-09/3021    Authorization - Visit Number 5    Authorization - Number of Visits 19    PT Start Time 1430    PT Stop Time 1515    PT Time Calculation (min) 45 min    Activity Tolerance Patient tolerated treatment well    Behavior During Therapy [facility] for tasks assessed/performed             History reviewed. No pertinent past medical history.  Past Surgical History:  Procedure Laterality Date   ABDOMINAL HYSTERECTOMY     BREAST LUMPECTOMY Bilateral    HERNIA REPAIR     SPINAL FUSION     TONSILLECTOMY      There were no vitals filed for this visit.   Subjective Assessment - 07/22/21 1557     Subjective I can pick objects off the floor easier now. I just want to breathe easier. I worked 1/2 day this Wednesday and it was still tough but not as bad as it was last time.    Patient is accompained by: Family member    Pertinent History 02/14/21 spinal fusion posterior thoracic T4-L4 performed by Dr Atilano Ina of [facility], working with cardiology and started Beta Blocker to address autonomic dysfunction for tachycardia and PVCs, ongoing Lt shoulder pain, Lt UE resting tremor since surgery    Currently in Pain? Yes    Pain Score 7     Pain Location Shoulder    Pain Orientation Left    Pain Descriptors / Indicators Sore    Multiple Pain Sites No            Treatment: Patient seen for aquatic therapy today.  Treatment took place in water 2.5-4 feet deep depending upon activity.   Pt entered the pool via stairs, mod use of rails, some extra time to get out of pool. Water temp 94 degrees F.  Seated water bench with 75% submersion Pt performed seated LE AROM exercises 20x in all planes, pain assessment concurrent  Standing in 75% depth: Water walking 6 lengths with large noodle for postural support in all directions Wall extercises: 15x Bil hip circles, flex/ext, abduction, mini squats all with min UE support of pool wall. High knee marching forward and backward with large noodle 4x VC to reach legs behind her.  Shoulder flex/ext with closed fingers 10x, hug the tree 10x closed fingers  Hamstring stretch on second step with bil hand rails 2x 30 sec with ankle DF/PF 10x each side                             PT Short Term Goals - 07/22/21 1602       PT SHORT TERM GOAL #3   Title Pt will be able to participate in aquatic and land-based appointments with min SOB throughout session    Time 4    Status Achieved  PT Long Term Goals - 07/01/21 1227       PT LONG TERM GOAL #1   Title Pt will be able to return to work for at least half-days most days of the week without exacerbation of fatigue or pain    Time 8    Period Weeks    Status New    Target Date 08/26/21      PT LONG TERM GOAL #2   Title Pt will feel strong enough to drive short community distances to improve access to work, appointments and errands.    Time 8    Period Weeks    Status New    Target Date 08/26/21      PT LONG TERM GOAL #3   Title Pt will participate in 6' walk test covering at least 900' with min SOB and BORG RPE >/= 13 to demo improved endurance.    Baseline 3' walk test, BORG RPE 12, min SOB, covered 480', HR reached 94 bpm    Time 8    Period Weeks    Status New    Target Date 08/26/21      PT LONG TERM GOAL #4   Title Pt will be ind with advanced HEP and understand how to safely progress    Time 8    Period Weeks    Status New     Target Date 08/26/21      PT LONG TERM GOAL #5   Title Reduce 5x sit to stand to </= 13 sec to demo improved functional LE strength and endurance    Time 8    Period Weeks    Status New    Target Date 08/26/21                   Plan - 07/22/21 1600     Clinical Impression Statement Pt demonstrates good tolerance of work load in the pool today, post ma be different. Pt did not require any rest breaks today and actually worked at a good pace. Verbal reports of RT hip and abdomin stretching but no increased pain.    Personal Factors and Comorbidities Comorbidity 1;Time since onset of injury/illness/exacerbation;Transportation;Comorbidity 2    Comorbidities autonomic dysregulation, spinal fusion T4-L4    Examination-Activity Limitations Locomotion Level;Bed Mobility;Bend;Sit;Stand;Dressing    Stability/Clinical Decision Making Evolving/Moderate complexity    PT Frequency 2x / week    PT Duration 8 weeks    PT Treatment/Interventions Aquatic Therapy;ADLs/Self Care Home Management;Electrical Stimulation;Cryotherapy;Moist Heat;Neuromuscular re-education;Balance training;Therapeutic exercise;Therapeutic activities;Functional mobility training;Gait training;Patient/family education;Manual techniques;Dry needling;Scar mobilization;Passive range of motion    PT Next Visit Plan aquatic PT and land based PT for general mobility, endurance, fascial release, hip ROM, LE flexibility, exercise progression    PT Home Exercise Plan Access Code: [email or web address]    Consulted and Agree with Plan of Care Patient             Patient will benefit from skilled therapeutic intervention in order to improve the following deficits and impairments:  Abnormal gait, Decreased range of motion, Increased fascial restricitons, Impaired tone, Postural dysfunction, Decreased strength, Decreased mobility, Impaired flexibility, Hypomobility, Decreased scar mobility, Pain, Decreased activity tolerance, Cardiopulmonary  status limiting activity, Decreased endurance  Visit Diagnosis: Muscle weakness (generalized)  Other abnormalities of gait and mobility  Pain in thoracic spine  Chronic bilateral low back pain without sciatica     Problem List Patient Active Problem List   Diagnosis Date Noted   Scoliosis (and kyphoscoliosis), idiopathic 02/06/2012  Glessie Eustice, PTA 07/22/2021, 4:04 PM  [facility] [address] [place], [place], [address] Phone: [phone]   Fax:  [phone]  Name: [name] MRN: [id number] Date of Birth: [birth year]

## 2021-07-26 ENCOUNTER — Ambulatory Visit: Payer: 59 | Admitting: Physical Therapy

## 2021-07-26 ENCOUNTER — Other Ambulatory Visit: Payer: Self-pay

## 2021-07-26 DIAGNOSIS — M546 Pain in thoracic spine: Secondary | ICD-10-CM

## 2021-07-26 DIAGNOSIS — G8929 Other chronic pain: Secondary | ICD-10-CM

## 2021-07-26 DIAGNOSIS — M6281 Muscle weakness (generalized): Secondary | ICD-10-CM | POA: Diagnosis not present

## 2021-07-26 DIAGNOSIS — M545 Low back pain, unspecified: Secondary | ICD-10-CM

## 2021-07-26 DIAGNOSIS — R2689 Other abnormalities of gait and mobility: Secondary | ICD-10-CM

## 2021-07-26 NOTE — Therapy (Signed)
[facility] [address] [place], [place], [address] Phone: [phone]   Fax:  [phone]  Physical Therapy Treatment  Patient Details  Name: [name] MRN: [id number] Date of Birth: May 01, 1971 Referring Provider (PT): Maurice Small, MD   Encounter Date: 07/26/2021   PT End of Session - 07/26/21 1215     Visit Number 6    Date for PT Re-Evaluation 08/26/21    Authorization Type UHC    Authorization Time Period 10/20/20-09/3021    Authorization - Visit Number 6    Authorization - Number of Visits 19    PT Start Time 0800    PT Stop Time 0845    PT Time Calculation (min) 45 min    Activity Tolerance Patient tolerated treatment well             No past medical history on file.  Past Surgical History:  Procedure Laterality Date   ABDOMINAL HYSTERECTOMY     BREAST LUMPECTOMY Bilateral    HERNIA REPAIR     SPINAL FUSION     TONSILLECTOMY      There were no vitals filed for this visit.   Subjective Assessment - 07/26/21 0804     Subjective I woke up late this morning.  Chronically tight in hips all the way around.  Some LBP today.  Pool therapy wipes me out.  Soreness afterwards.  Just got an exercise ball for home but haven't blown up yet.  States she does her 17 ex's on HEP daily.    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 Yes    Pain Score 3     Pain Location Back    Pain Orientation Right;Left    Pain Type Surgical pain;Chronic pain                               [facility] Adult PT  Treatment/Exercise - 07/26/21 0001       Lumbar Exercises: Stretches   Other Lumbar Stretch Exercise supine green ball roll for hip/knee flexion 8x; frog leg 8x    Other Lumbar Stretch Exercise supine green ball place and hold ankle over opp knee 3x      Lumbar Exercises: Aerobic   Nustep L2 6 min while discussing status      Lumbar Exercises: Supine   Other Supine Lumbar Exercises green ball isometric UE press 5x    Other Supine Lumbar Exercises green ball isometric knee press 5x 5 sec holds      Lumbar Exercises: Quadruped   Other Quadruped Lumbar Exercises rocking 10x; crossed ankles 5x each way      Manual Therapy   Soft tissue mobilization right and left sidelying to thoracolumbar fascia with Free Up                    PT Education - 07/26/21 1211     Education Details ex ball supine ex's; quad rocking    Person(s) Educated Patient    Methods Explanation;Demonstration;Handout    Comprehension  Returned demonstration;Verbalized understanding              PT Short Term Goals - 07/26/21 1224       PT SHORT TERM GOAL #1   Title Pt will learn self massage and assisted massage techniques (husband can help) for improved scar and fascial mobility.    Status Achieved      PT SHORT TERM GOAL #2   Title Pt will be able to don/doff Lt shoe and sock with at least 50% greater ease due to improved Lt hip mobility.    Time 4    Period Weeks    Status On-going      PT SHORT TERM GOAL #3   Title Pt will be able to participate in aquatic and land-based appointments with min SOB throughout session    Status Achieved      PT SHORT TERM GOAL #4   Title Reduce 5x sit to stand to </= 17 sec    Time 4    Period Weeks    Status On-going               PT Long Term Goals - 07/01/21 1227       PT LONG TERM GOAL #1   Title Pt will be able to return to work for at least half-days most days of the week without exacerbation of fatigue or pain    Time 8    Period  Weeks    Status New    Target Date 08/26/21      PT LONG TERM GOAL #2   Title Pt will feel strong enough to drive short community distances to improve access to work, appointments and errands.    Time 8    Period Weeks    Status New    Target Date 08/26/21      PT LONG TERM GOAL #3   Title Pt will participate in 6' walk test covering at least 900' with min SOB and BORG RPE >/= 13 to demo improved endurance.    Baseline 3' walk test, BORG RPE 12, min SOB, covered 480', HR reached 94 bpm    Time 8    Period Weeks    Status New    Target Date 08/26/21      PT LONG TERM GOAL #4   Title Pt will be ind with advanced HEP and understand how to safely progress    Time 8    Period Weeks    Status New    Target Date 08/26/21      PT LONG TERM GOAL #5   Title Reduce 5x sit to stand to </= 13 sec to demo improved functional LE strength and endurance    Time 8    Period Weeks    Status New    Target Date 08/26/21                   Plan - 07/26/21 0843     Clinical Impression Statement The patient reports good compliance with current HEP.  She just got an exercise ball for home so we performed a trial of ex's to improve hip mobility as well as activation of transverse abdominus muscles for stabilization of L4-5, L5-S1.   Tolerated quadruped position fairly well.   Decreased thoracolumbar fascia noted but improved with soft tissue mobilization.  Therapist very closely monitoring response throughout treatment session and modifying accordingly.    Personal Factors and Comorbidities Comorbidity 1;Time since onset of injury/illness/exacerbation;Transportation;Comorbidity  2    Comorbidities autonomic dysregulation, spinal fusion T4-L4    Examination-Activity Limitations Locomotion Level;Bed Mobility;Bend;Sit;Stand;Dressing    Examination-Participation Restrictions Driving;Community Activity;Cleaning;Meal Prep;Occupation    Rehab Potential Excellent    PT Frequency 2x / week    PT  Duration 8 weeks    PT Treatment/Interventions Aquatic Therapy;ADLs/Self Care Home Management;Electrical Stimulation;Cryotherapy;Moist Heat;Neuromuscular re-education;Balance training;Therapeutic exercise;Therapeutic activities;Functional mobility training;Gait training;Patient/family education;Manual techniques;Dry needling;Scar mobilization;Passive range of motion    PT Next Visit Plan check STGS;  aquatic PT and land based PT for general mobility, endurance, fascial release, hip ROM, LE flexibility, exercise progression;  pt got an exercise ball for home    PT Home Exercise Plan Access Code: [email or web address]             Patient will benefit from skilled therapeutic intervention in order to improve the following deficits and impairments:  Abnormal gait, Decreased range of motion, Increased fascial restricitons, Impaired tone, Postural dysfunction, Decreased strength, Decreased mobility, Impaired flexibility, Hypomobility, Decreased scar mobility, Pain, Decreased activity tolerance, Cardiopulmonary status limiting activity, Decreased endurance  Visit Diagnosis: Muscle weakness (generalized)  Other abnormalities of gait and mobility  Pain in thoracic spine  Chronic bilateral low back pain without sciatica     Problem List Patient Active Problem List   Diagnosis Date Noted   Scoliosis (and kyphoscoliosis), idiopathic 02/06/2012   Ruben Im, PT 07/26/21 12:26 PM Phone: [phone] Fax: [phone]  Alvera Singh 07/26/2021, 12:25 PM  [facility] [address], [place], [place], [address] Phone: [phone]   Fax:  [phone]  Name: [name] MRN: [id number] Date of Birth: 12-24-1970

## 2021-07-26 NOTE — Patient Instructions (Signed)
Access Code: [email or web address] URL: [email or web address] Date: 07/26/2021 Prepared by: Ruben Im  Exercises Supine Figure 4 Piriformis Stretch - 1 x daily - 7 x weekly - 1 sets - 3 reps - 20 hold Hooklying Single Knee to Chest Stretch - 1 x daily - 7 x weekly - 1 sets - 3 reps - 20 hold Supine Piriformis Stretch Pulling Heel to Hip - 1 x daily - 7 x weekly - 1 sets - 3 reps - 20 hold Sit to Stand - 1 x daily - 7 x weekly - 2 sets - 10 reps Seated Long Arc Quad - 1 x daily - 7 x weekly - 2 sets - 10 reps Seated Hip Abduction with Resistance - 1 x daily - 7 x weekly - 2 sets - 10 reps Seated High Shoulder Row with Anchored Resistance - 1 x daily - 7 x weekly - 2 sets - 10 reps Seated Shoulder Extension and Scapular Retraction with Resistance - 1 x daily - 7 x weekly - 2 sets - 10 reps Standing Plank on Wall - 1 x daily - 7 x weekly - 1 sets - 5 reps - 10 hold Standing Single Arm Shoulder Flexion Stretch on Wall - 1 x daily - 7 x weekly - 1 sets - 3 reps - 10 hold Standing Lumbar Spine Flexion Stretch Counter - 1 x daily - 7 x weekly - 1 sets - 3 reps - 10 hold Standing Hip Abduction with Counter Support - 1 x daily - 7 x weekly - 3 sets - 5 reps Single Leg Balance in March Position - 1 x daily - 7 x weekly - 2 sets - 20 reps Supine Hip and Knee Flexion AROM with Swiss Ball - 1 x daily - 7 x weekly - 1 sets - 10 reps Supine Piriformis Stretch with Swiss Ball - 1 x daily - 7 x weekly - 1 sets - 3 reps Hooklying Isometric Hip Flexion - 1 x daily - 7 x weekly - 1 sets - 5 reps Quadruped Rocking Slow - 1 x daily - 7 x weekly - 1 sets - 10 reps

## 2021-07-29 ENCOUNTER — Encounter: Payer: Self-pay | Admitting: Physical Therapy

## 2021-07-29 ENCOUNTER — Ambulatory Visit: Payer: 59 | Admitting: Physical Therapy

## 2021-07-29 ENCOUNTER — Other Ambulatory Visit: Payer: Self-pay

## 2021-07-29 DIAGNOSIS — M546 Pain in thoracic spine: Secondary | ICD-10-CM

## 2021-07-29 DIAGNOSIS — M6281 Muscle weakness (generalized): Secondary | ICD-10-CM | POA: Diagnosis not present

## 2021-07-29 DIAGNOSIS — R2689 Other abnormalities of gait and mobility: Secondary | ICD-10-CM

## 2021-07-29 DIAGNOSIS — G8929 Other chronic pain: Secondary | ICD-10-CM

## 2021-07-29 DIAGNOSIS — M545 Low back pain, unspecified: Secondary | ICD-10-CM

## 2021-07-29 NOTE — Therapy (Signed)
[facility] [address], [place], [place], [address] Phone: [phone]   Fax:  [phone]  Physical Therapy Treatment  Patient Details  Name: [name] MRN: [id number] Date of Birth: October 05, 1971 Referring Provider (PT): Maurice Small, MD   Encounter Date: 07/29/2021   PT End of Session - 07/29/21 1530     Visit Number 7    Date for PT Re-Evaluation 08/26/21    Authorization Type UHC    Authorization Time Period 10/20/20-09/3021    Authorization - Visit Number 7    Authorization - Number of Visits 19    PT Start Time 1340    PT Stop Time 1420    PT Time Calculation (min) 40 min    Activity Tolerance Patient tolerated treatment well    Behavior During Therapy [facility] for tasks assessed/performed             History reviewed. No pertinent past medical history.  Past Surgical History:  Procedure Laterality Date   ABDOMINAL HYSTERECTOMY     BREAST LUMPECTOMY Bilateral    HERNIA REPAIR     SPINAL FUSION     TONSILLECTOMY      There were no vitals filed for this visit.   Subjective Assessment - 07/29/21 1529     Subjective I tied my shoes, it was hard but I did it.    Patient is accompained by: Family member    Pertinent History 02/14/21 spinal fusion posterior thoracic T4-L4 performed by Dr Atilano Ina of [facility], working with cardiology and started Beta Blocker to address autonomic dysfunction for tachycardia and PVCs, ongoing Lt shoulder pain, Lt UE resting tremor since surgery    Currently in Pain? --   I feel my hardware today           Treatment: Patient seen for aquatic therapy today.  Treatment took place in water 2.5-4 feet deep depending upon activity.  Pt entered the pool via stairs, reciprocally, moderate use of rails. Water temp 94 degrees F. Pt requires buoyancy of water for support and to offload joints with strengthening exercises.    Seated water bench with 75% submersion Pt  performed seated LE AROM exercises 20x in all planes, added ankle cuff wts for LAQ/ham pulls 10x Bil: pain assessment concurrent.  Standing in 75% depth for: Water walking added support, VC to swing UE in water more. 6x in each direction Wall exercises with min or no UE support: 15x Bil for hip 3 ways, mini squats, heel squats,  High knee marching with large noodle 4 lengths Standing hamstring stretch Bil on second step  holding onto rails. 2x 30 sec  Childs pose back stretch at wall 3x 30 sec                              PT Short Term Goals - 07/26/21 1224       PT SHORT TERM GOAL #1   Title Pt will learn self massage and assisted massage techniques (husband can help) for improved scar and fascial mobility.    Status Achieved      PT SHORT TERM GOAL #2   Title Pt will be able to don/doff Lt shoe and sock with at least 50% greater ease due to improved Lt hip mobility.    Time 4    Period Weeks    Status On-going      PT SHORT TERM GOAL #3  Title Pt will be able to participate in aquatic and land-based appointments with min SOB throughout session    Status Achieved      PT SHORT TERM GOAL #4   Title Reduce 5x sit to stand to </= 17 sec    Time 4    Period Weeks    Status On-going               PT Long Term Goals - 07/01/21 1227       PT LONG TERM GOAL #1   Title Pt will be able to return to work for at least half-days most days of the week without exacerbation of fatigue or pain    Time 8    Period Weeks    Status New    Target Date 08/26/21      PT LONG TERM GOAL #2   Title Pt will feel strong enough to drive short community distances to improve access to work, appointments and errands.    Time 8    Period Weeks    Status New    Target Date 08/26/21      PT LONG TERM GOAL #3   Title Pt will participate in 6' walk test covering at least 900' with min SOB and BORG RPE >/= 13 to demo improved endurance.    Baseline 3' walk test, BORG  RPE 12, min SOB, covered 480', HR reached 94 bpm    Time 8    Period Weeks    Status New    Target Date 08/26/21      PT LONG TERM GOAL #4   Title Pt will be ind with advanced HEP and understand how to safely progress    Time 8    Period Weeks    Status New    Target Date 08/26/21      PT LONG TERM GOAL #5   Title Reduce 5x sit to stand to </= 13 sec to demo improved functional LE strength and endurance    Time 8    Period Weeks    Status New    Target Date 08/26/21                   Plan - 07/29/21 1531     Clinical Impression Statement Pt reports she feels like her LE continue to get stronger. Pt was able to tie her shoe independently yesterday. 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    Personal Factors and Comorbidities Comorbidity 1;Time since onset of injury/illness/exacerbation;Transportation;Comorbidity 2    Comorbidities autonomic dysregulation, spinal fusion T4-L4    Examination-Activity Limitations Locomotion Level;Bed Mobility;Bend;Sit;Stand;Dressing    Examination-Participation Restrictions Driving;Community Activity;Cleaning;Meal Prep;Occupation    Stability/Clinical Decision Making Evolving/Moderate complexity    Rehab Potential Excellent    PT Frequency 2x / week    PT Duration 8 weeks    PT Treatment/Interventions Aquatic Therapy;ADLs/Self Care Home Management;Electrical Stimulation;Cryotherapy;Moist Heat;Neuromuscular re-education;Balance training;Therapeutic exercise;Therapeutic activities;Functional mobility training;Gait training;Patient/family education;Manual techniques;Dry needling;Scar mobilization;Passive range of motion    PT Next Visit Plan check STGS;  aquatic PT and land based PT for general mobility, endurance, fascial release, hip ROM, LE flexibility, exercise progression;  pt got an exercise ball for home    PT Home Exercise Plan  Access Code: [email or web address]    Consulted and Agree with Plan of Care Patient  Patient will benefit from skilled therapeutic intervention in order to improve the following deficits and impairments:  Abnormal gait, Decreased range of motion, Increased fascial restricitons, Impaired tone, Postural dysfunction, Decreased strength, Decreased mobility, Impaired flexibility, Hypomobility, Decreased scar mobility, Pain, Decreased activity tolerance, Cardiopulmonary status limiting activity, Decreased endurance  Visit Diagnosis: Muscle weakness (generalized)  Other abnormalities of gait and mobility  Pain in thoracic spine  Chronic bilateral low back pain without sciatica     Problem List Patient Active Problem List   Diagnosis Date Noted   Scoliosis (and kyphoscoliosis), idiopathic 02/06/2012    Jazlin Tapscott, PTA 07/29/2021, 3:34 PM  [facility] [address], [place], [place], [address] Phone: [phone]   Fax:  [phone]  Name: [name] MRN: [id number] Date of Birth: 08-28-1971

## 2021-08-03 ENCOUNTER — Ambulatory Visit: Payer: 59 | Admitting: Physical Therapy

## 2021-08-03 ENCOUNTER — Encounter: Payer: Self-pay | Admitting: Physical Therapy

## 2021-08-03 ENCOUNTER — Other Ambulatory Visit: Payer: Self-pay

## 2021-08-03 DIAGNOSIS — M546 Pain in thoracic spine: Secondary | ICD-10-CM

## 2021-08-03 DIAGNOSIS — G8929 Other chronic pain: Secondary | ICD-10-CM

## 2021-08-03 DIAGNOSIS — M6281 Muscle weakness (generalized): Secondary | ICD-10-CM

## 2021-08-03 DIAGNOSIS — M545 Low back pain, unspecified: Secondary | ICD-10-CM

## 2021-08-03 DIAGNOSIS — R2689 Other abnormalities of gait and mobility: Secondary | ICD-10-CM

## 2021-08-03 NOTE — Therapy (Signed)
[facility] [address] [place], [place], [address] Phone: [phone]   Fax:  [phone]  Physical Therapy Treatment  Patient Details  Name: [name] MRN: [id number] Date of Birth: 20-Jul-1971 Referring Provider (PT): Maurice Small, MD   Encounter Date: 08/03/2021   PT End of Session - 08/03/21 0755     Visit Number 8    Date for PT Re-Evaluation 08/26/21    Authorization Type UHC    Authorization Time Period 10/20/20-09/3021    Authorization - Visit Number 8    Authorization - Number of Visits 19    PT Start Time 0756    PT Stop Time 0842    PT Time Calculation (min) 46 min    Activity Tolerance Patient tolerated treatment well    Behavior During Therapy [facility] for tasks assessed/performed             History reviewed. No pertinent past medical history.  Past Surgical History:  Procedure Laterality Date   ABDOMINAL HYSTERECTOMY     BREAST LUMPECTOMY Bilateral    HERNIA REPAIR     SPINAL FUSION     TONSILLECTOMY      There were no vitals filed for this visit.   Subjective Assessment - 08/03/21 0755     Subjective I can tied my shoes now - it is hard but I can do it. I have been very stiff this week.  I had Rt LBP with my sit to stands 2 days ago so took yesterday off from my program.  My tightness feels more out of control this week.  I am trying to work 5-6 hours a day at home.  I am up to walking for about 1 hour outdoors each day.    Pertinent History 02/14/21 spinal fusion posterior thoracic T4-L4 performed by Dr Atilano Ina of [facility], working with cardiology and started Beta Blocker to address autonomic dysfunction for tachycardia and PVCs, ongoing Lt shoulder pain, Lt UE resting tremor since surgery    How long can you walk comfortably? 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 Yes    Pain Score 10-Worst pain ever    Pain Location Back    Pain Orientation Right;Left    Pain Descriptors / Indicators Tightness    Pain Type Surgical pain;Chronic pain    Pain Onset More than a month ago    Pain Frequency Constant    Aggravating Factors  as day goes on                               [facility] Adult PT Treatment/Exercise - 08/03/21 0001       Self-Care   Self-Care Other Self-Care Comments    Other Self-Care Comments  trial of use of Pt's massage gun with spine stretches      Neuro Re-ed    Neuro Re-ed Details  gait training for dissociated movement of hips and pelvis from trunk and shoulders, incorporation of arm swing, PT gave TC at pelvis and VC for opp arm/leg      Lumbar Exercises: Stretches   Active Hamstring Stretch Left;Right;2 reps;20 seconds    Active Hamstring Stretch Limitations seated, ankle PF to avoid neural stretch    Hip Flexor Stretch  Left;Right;3 reps;10 seconds    Hip Flexor Stretch Limitations foot on 2nd step    Gastroc Stretch 2 reps;Left;Right;20 seconds    Gastroc Stretch Limitations slant board      Lumbar Exercises: Aerobic   Nustep L4 x 6' PT present to discuss status      Lumbar Exercises: Seated   Other Seated Lumbar Exercises on green ball pelvic ROM cirlces, tail wags, tucks and lifts x 10 each as able for ROM      Manual Therapy   Manual Therapy Soft tissue mobilization;Joint mobilization    Joint Mobilization UPAs Rt L4-S1, Rt sacral border    Soft tissue mobilization Addaday assisted (Pt's from home) Rt gluteals, piriformis, QL and lumbar paraspinals in quadruped draped upper body on green ball with rocking                       PT Short Term Goals - 07/26/21 1224       PT SHORT TERM GOAL #1   Title Pt will learn self massage and assisted massage techniques (husband can help) for improved scar and fascial mobility.    Status Achieved      PT SHORT TERM GOAL #2    Title Pt will be able to don/doff Lt shoe and sock with at least 50% greater ease due to improved Lt hip mobility.    Time 4    Period Weeks    Status On-going      PT SHORT TERM GOAL #3   Title Pt will be able to participate in aquatic and land-based appointments with min SOB throughout session    Status Achieved      PT SHORT TERM GOAL #4   Title Reduce 5x sit to stand to </= 17 sec    Time 4    Period Weeks    Status On-going               PT Long Term Goals - 07/01/21 1227       PT LONG TERM GOAL #1   Title Pt will be able to return to work for at least half-days most days of the week without exacerbation of fatigue or pain    Time 8    Period Weeks    Status New    Target Date 08/26/21      PT LONG TERM GOAL #2   Title Pt will feel strong enough to drive short community distances to improve access to work, appointments and errands.    Time 8    Period Weeks    Status New    Target Date 08/26/21      PT LONG TERM GOAL #3   Title Pt will participate in 6' walk test covering at least 900' with min SOB and BORG RPE >/= 13 to demo improved endurance.    Baseline 3' walk test, BORG RPE 12, min SOB, covered 480', HR reached 94 bpm    Time 8    Period Weeks    Status New    Target Date 08/26/21      PT LONG TERM GOAL #4   Title Pt will be ind with advanced HEP and understand how to safely progress    Time 8    Period Weeks    Status New    Target Date 08/26/21      PT LONG TERM GOAL #5   Title Reduce 5x sit to stand to </= 13 sec to demo  improved functional LE strength and endurance    Time 8    Period Weeks    Status New    Target Date 08/26/21                   Plan - 08/03/21 0802     Clinical Impression Statement Pt had a set back this week doing sit to stands for HEP.  She took yesterday off and arrived with report of 10/10 tightness today.  She does report she is now working up to 5-6 hours from home and walks 60 min outdoors most days of  the week.  She has been very diligent with HEP.  PT used Addaday assisted STM in quadruped with rocking to Rt lumbar and posterior hip today.  PT also worked with Pt on dissociation of movement of hips and pelvis from trunk to decrease rigid patterns of movement and gait.  PT used VC and TC with gait training.  Pt has a hard time finding pelvic ROM sitting on ball and with gait training.  She reported 8/10 tightness end of session.  Continue along POC.    PT Frequency 2x / week    PT Duration 8 weeks    PT Treatment/Interventions Aquatic Therapy;ADLs/Self Care Home Management;Electrical Stimulation;Cryotherapy;Moist Heat;Neuromuscular re-education;Balance training;Therapeutic exercise;Therapeutic activities;Functional mobility training;Gait training;Patient/family education;Manual techniques;Dry needling;Scar mobilization;Passive range of motion    PT Next Visit Plan aquatic next, land-based next visit focus on Pt lower abdominal manual stretches, check STGS;  aquatic PT and land based PT for general mobility, endurance, fascial release, hip ROM, LE flexibility, exercise progression;  pt got an exercise ball for home    PT Home Exercise Plan Access Code: [email or web address]             Patient will benefit from skilled therapeutic intervention in order to improve the following deficits and impairments:     Visit Diagnosis: Muscle weakness (generalized)  Other abnormalities of gait and mobility  Pain in thoracic spine  Chronic bilateral low back pain without sciatica     Problem List Patient Active Problem List   Diagnosis Date Noted   Scoliosis (and kyphoscoliosis), idiopathic 02/06/2012    Baruch Merl, PT 08/03/21 8:46 AM   [facility] [address] [place], [place], [address] Phone: [phone]   Fax:  [phone]  Name: [name] MRN: [id number] Date of Birth: Jun 29, 1971

## 2021-08-05 ENCOUNTER — Other Ambulatory Visit: Payer: Self-pay

## 2021-08-05 ENCOUNTER — Encounter: Payer: Self-pay | Admitting: Physical Therapy

## 2021-08-05 ENCOUNTER — Ambulatory Visit: Payer: 59 | Admitting: Physical Therapy

## 2021-08-05 DIAGNOSIS — R2689 Other abnormalities of gait and mobility: Secondary | ICD-10-CM

## 2021-08-05 DIAGNOSIS — G8929 Other chronic pain: Secondary | ICD-10-CM

## 2021-08-05 DIAGNOSIS — M6281 Muscle weakness (generalized): Secondary | ICD-10-CM | POA: Diagnosis not present

## 2021-08-05 DIAGNOSIS — M546 Pain in thoracic spine: Secondary | ICD-10-CM

## 2021-08-05 NOTE — Therapy (Signed)
[facility] [address], [place], [place], [address] Phone: [phone]   Fax:  [phone]  Physical Therapy Treatment  Patient Details  Name: [name] MRN: [id number] Date of Birth: 20-Jan-1971 Referring Provider (PT): Maurice Small, MD   Encounter Date: 08/05/2021   PT End of Session - 08/05/21 1517     Visit Number 9    Date for PT Re-Evaluation 08/26/21    Authorization Type UHC    Authorization Time Period 10/20/20-09/3021    Authorization - Visit Number 9    Authorization - Number of Visits 19    PT Start Time [id number]    PT Stop Time 1255    PT Time Calculation (min) 45 min    Activity Tolerance Patient tolerated treatment well    Behavior During Therapy [facility] for tasks assessed/performed             History reviewed. No pertinent past medical history.  Past Surgical History:  Procedure Laterality Date   ABDOMINAL HYSTERECTOMY     BREAST LUMPECTOMY Bilateral    HERNIA REPAIR     SPINAL FUSION     TONSILLECTOMY      There were no vitals filed for this visit.   Subjective Assessment - 08/05/21 1515     Subjective I was super sore after working on my sassy walk, it's good though. I can feel more muscles working that I haven't felt in awhile.    Patient is accompained by: Family member    Pertinent History 02/14/21 spinal fusion posterior thoracic T4-L4 performed by Dr [facility]tilano Ina of [facility], working with cardiology and started Beta Blocker to address autonomic dysfunction for tachycardia and PVCs, ongoing Lt shoulder pain, Lt UE resting tremor since surgery    Currently in Pain? Yes   Hips are sore, RT> LT          Treatment: Patient seen for aquatic therapy today.  Treatment took place in water 2.5-4 feet deep depending upon activity.  Pt entered the pool via stairs, reciprocally with mod use of hand rails due to hip soreness. Pt requires buoyancy of water for support and to offload  joints with strengthening exercises.  Water temp 92 degrees F.  Seated water bench with 75% submersion Pt performed seated LE AROM exercises 20x in all planes, with concurrent discussion of pain and current status.   Standing in 75% submersion: Water walking with emphasis on UE movement and pelvic rotation. 6 lengths of each direction. 4 lengths of toe tap across body with small pelvic rotation, alternating sides. Wall exercises: increased to 20x each except hip ex/flex 15x. All Bil  Quad stretching with foot on bench 3x 10 sec Bil  Seated decompression with yellow noodle behind pt: 1 min, thn bicycle 30 sec:  3 sets of this                               PT Short Term Goals - 07/26/21 1224       PT SHORT TERM GOAL #1   Title Pt will learn self massage and assisted massage techniques (husband can help) for improved scar and fascial mobility.    Status Achieved      PT SHORT TERM GOAL #2   Title Pt will be able to don/doff Lt shoe and sock with at least 50% greater ease due to improved Lt hip mobility.    Time 4  Period Weeks    Status On-going      PT SHORT TERM GOAL #3   Title Pt will be able to participate in aquatic and land-based appointments with min SOB throughout session    Status Achieved      PT SHORT TERM GOAL #4   Title Reduce 5x sit to stand to </= 17 sec    Time 4    Period Weeks    Status On-going               PT Long Term Goals - 07/01/21 1227       PT LONG TERM GOAL #1   Title Pt will be able to return to work for at least half-days most days of the week without exacerbation of fatigue or pain    Time 8    Period Weeks    Status New    Target Date 08/26/21      PT LONG TERM GOAL #2   Title Pt will feel strong enough to drive short community distances to improve access to work, appointments and errands.    Time 8    Period Weeks    Status New    Target Date 08/26/21      PT LONG TERM GOAL #3   Title Pt will  participate in 6' walk test covering at least 900' with min SOB and BORG RPE >/= 13 to demo improved endurance.    Baseline 3' walk test, BORG RPE 12, min SOB, covered 480', HR reached 94 bpm    Time 8    Period Weeks    Status New    Target Date 08/26/21      PT LONG TERM GOAL #4   Title Pt will be ind with advanced HEP and understand how to safely progress    Time 8    Period Weeks    Status New    Target Date 08/26/21      PT LONG TERM GOAL #5   Title Reduce 5x sit to stand to </= 13 sec to demo improved functional LE strength and endurance    Time 8    Period Weeks    Status New    Target Date 08/26/21                   Plan - 08/05/21 1517     Clinical Impression Statement Pt arrives to aquatic PT with sore hips/pelvis from working on her "sassy walk." She is encouraged because she reports feeling muscles work she hasn't felt in a long time. We continued to work on that in the pool today as well increasing her hip AROM and overall functional activity tolerance and strength. No reports of increasing pain.    Personal Factors and Comorbidities Comorbidity 1;Time since onset of injury/illness/exacerbation;Transportation;Comorbidity 2    Comorbidities autonomic dysregulation, spinal fusion T4-L4    Examination-Activity Limitations Locomotion Level;Bed Mobility;Bend;Sit;Stand;Dressing    Stability/Clinical Decision Making Evolving/Moderate complexity    Rehab Potential Excellent    PT Frequency 2x / week    PT Treatment/Interventions Aquatic Therapy;ADLs/Self Care Home Management;Electrical Stimulation;Cryotherapy;Moist Heat;Neuromuscular re-education;Balance training;Therapeutic exercise;Therapeutic activities;Functional mobility training;Gait training;Patient/family education;Manual techniques;Dry needling;Scar mobilization;Passive range of motion    PT Next Visit Plan Check STGs, continue with general mobility, endurance, fascial release, hip ROM    PT Home Exercise Plan  Access Code: [email or web address]             Patient will benefit from skilled therapeutic intervention in order to  improve the following deficits and impairments:  Abnormal gait, Decreased range of motion, Increased fascial restricitons, Impaired tone, Postural dysfunction, Decreased strength, Decreased mobility, Impaired flexibility, Hypomobility, Decreased scar mobility, Pain, Decreased activity tolerance, Cardiopulmonary status limiting activity, Decreased endurance  Visit Diagnosis: Muscle weakness (generalized)  Other abnormalities of gait and mobility  Pain in thoracic spine  Chronic bilateral low back pain without sciatica     Problem List Patient Active Problem List   Diagnosis Date Noted   Scoliosis (and kyphoscoliosis), idiopathic 02/06/2012    Abdikadir Fohl, PTA 08/05/2021, 3:21 PM  [facility] [address] [place], [place], [address] Phone: [phone]   Fax:  [phone]  Name: Norma Zakarian MRN: [id number] Date of Birth: 10/20/1971

## 2021-08-08 ENCOUNTER — Ambulatory Visit: Payer: 59 | Admitting: Physical Therapy

## 2021-08-08 ENCOUNTER — Encounter: Payer: Self-pay | Admitting: Physical Therapy

## 2021-08-08 ENCOUNTER — Other Ambulatory Visit: Payer: Self-pay

## 2021-08-08 DIAGNOSIS — G8929 Other chronic pain: Secondary | ICD-10-CM

## 2021-08-08 DIAGNOSIS — R2689 Other abnormalities of gait and mobility: Secondary | ICD-10-CM

## 2021-08-08 DIAGNOSIS — M545 Low back pain, unspecified: Secondary | ICD-10-CM

## 2021-08-08 DIAGNOSIS — M6281 Muscle weakness (generalized): Secondary | ICD-10-CM | POA: Diagnosis not present

## 2021-08-08 DIAGNOSIS — M546 Pain in thoracic spine: Secondary | ICD-10-CM

## 2021-08-08 NOTE — Therapy (Signed)
[facility] [address], [place], [place], [address] Phone: [phone]   Fax:  [phone]  Physical Therapy Treatment  Patient Details  Name: [name] MRN: [id number] Date of Birth: Oct 26, 1971 Referring Provider (PT): Maurice Small, MD   Encounter Date: 08/08/2021   PT End of Session - 08/08/21 1126     Visit Number 10    Date for PT Re-Evaluation 08/26/21    Authorization Type UHC    Authorization Time Period 10/20/20-09/3021    Authorization - Visit Number 10    Authorization - Number of Visits 19    PT Start Time 0802    PT Stop Time 2458    PT Time Calculation (min) 45 min    Activity Tolerance Patient tolerated treatment well    Behavior During Therapy [facility] for tasks assessed/performed             History reviewed. No pertinent past medical history.  Past Surgical History:  Procedure Laterality Date   ABDOMINAL HYSTERECTOMY     BREAST LUMPECTOMY Bilateral    HERNIA REPAIR     SPINAL FUSION     TONSILLECTOMY      There were no vitals filed for this visit.   Subjective Assessment - 08/08/21 0806     Subjective My muscles have been in overdrive since starting to work on my sassy walk - I think it is good but my muscles are mad and I was very uncomfortable.  I have new popping in my Lt low back every time I walk now.    Pertinent History 02/14/21 spinal fusion posterior thoracic T4-L4 performed by Dr Atilano Ina of [facility], working with cardiology and started Beta Blocker to address autonomic dysfunction for tachycardia and PVCs, ongoing Lt shoulder pain, Lt UE resting tremor since surgery    How long can you walk comfortably? 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 Yes    Pain Score 10-Worst pain ever    Pain Location Back    Pain Orientation Right;Left;Lower     Pain Descriptors / Indicators Tightness    Pain Radiating Towards adominal wall    Pain Onset More than a month ago    Pain Frequency Constant    Aggravating Factors  as day goes on                               [facility] Adult PT Treatment/Exercise - 08/08/21 0001       Self-Care   Self-Care Other Self-Care Comments    Other Self-Care Comments  how husband can do cross body fascial massage from lateral trunk to abdominal midline (Rt and Lt), Pt instructed how to perform lower abdominal fascial stretching with hands      Exercises   Exercises Lumbar;Knee/Hip;Shoulder      Lumbar Exercises: Stretches   Other Lumbar Stretch Exercise trunk flexion counter "L" 2x20 sec      Lumbar Exercises: Aerobic   Nustep L4 x 4' PT present to discuss status      Knee/Hip Exercises: Stretches   Active Hamstring Stretch Left;Right;2 reps;20 seconds    Hip Flexor Stretch Left;Right;2 reps;10 seconds    Hip Flexor Stretch Limitations foot on 2nd step      Manual  Therapy   Manual Therapy Myofascial release;Soft tissue mobilization    Soft tissue mobilization rectus abdominus elongation bil    Myofascial Release lower abdominal fascial stretching, diaphragm release bil, obliques from back to front at diagonals towards abdominal midline                       PT Short Term Goals - 07/26/21 1224       PT SHORT TERM GOAL #1   Title Pt will learn self massage and assisted massage techniques (husband can help) for improved scar and fascial mobility.    Status Achieved      PT SHORT TERM GOAL #2   Title Pt will be able to don/doff Lt shoe and sock with at least 50% greater ease due to improved Lt hip mobility.    Time 4    Period Weeks    Status On-going      PT SHORT TERM GOAL #3   Title Pt will be able to participate in aquatic and land-based appointments with min SOB throughout session    Status Achieved      PT SHORT TERM GOAL #4   Title Reduce 5x sit to  stand to </= 17 sec    Time 4    Period Weeks    Status On-going               PT Long Term Goals - 07/01/21 1227       PT LONG TERM GOAL #1   Title Pt will be able to return to work for at least half-days most days of the week without exacerbation of fatigue or pain    Time 8    Period Weeks    Status New    Target Date 08/26/21      PT LONG TERM GOAL #2   Title Pt will feel strong enough to drive short community distances to improve access to work, appointments and errands.    Time 8    Period Weeks    Status New    Target Date 08/26/21      PT LONG TERM GOAL #3   Title Pt will participate in 6' walk test covering at least 900' with min SOB and BORG RPE >/= 13 to demo improved endurance.    Baseline 3' walk test, BORG RPE 12, min SOB, covered 480', HR reached 94 bpm    Time 8    Period Weeks    Status New    Target Date 08/26/21      PT LONG TERM GOAL #4   Title Pt will be ind with advanced HEP and understand how to safely progress    Time 8    Period Weeks    Status New    Target Date 08/26/21      PT LONG TERM GOAL #5   Title Reduce 5x sit to stand to </= 13 sec to demo improved functional LE strength and endurance    Time 8    Period Weeks    Status New    Target Date 08/26/21                   Plan - 08/08/21 1127     Clinical Impression Statement Pt reported she had increased muscle soreness and tension after trying to work on incorporation of more pelvic rotation with her gait last week.  She continues to have signif tone and fascial tension across abdominals and thoracic  spine.  She has signif fascial restriction across obliques and lower abdomen.  Her diaphragm is very tight Rt>Lt.  PT performed myofascial release, diaphragm release, and STM to rectus abdominis bil today.  PT instructed Pt on how husband can do cross body flat handed myofascial massage from lateral trunk to abdominal midline bil and how Pt can do self-stretching to lower  abdominal fascia in 3 directions.  Pt has difficulty taking diaphragmatic breath and getting ribcage to move posterior and laterally.  She continues to work hard with HEP and in aquatic sessions.    Comorbidities autonomic dysregulation, spinal fusion T4-L4    Rehab Potential Excellent    PT Frequency 2x / week    PT Duration 8 weeks    PT Treatment/Interventions Aquatic Therapy;ADLs/Self Care Home Management;Electrical Stimulation;Cryotherapy;Moist Heat;Neuromuscular re-education;Balance training;Therapeutic exercise;Therapeutic activities;Functional mobility training;Gait training;Patient/family education;Manual techniques;Dry needling;Scar mobilization;Passive range of motion    PT Next Visit Plan next land appointment do 5x sit to stand for STG, aquatic PT next - arm swing with contralateral step focus, mobiity, stretching, endurance    PT Home Exercise Plan Access Code: [email or web address]    Consulted and Agree with Plan of Care Patient             Patient will benefit from skilled therapeutic intervention in order to improve the following deficits and impairments:     Visit Diagnosis: Muscle weakness (generalized)  Other abnormalities of gait and mobility  Pain in thoracic spine  Chronic bilateral low back pain without sciatica     Problem List Patient Active Problem List   Diagnosis Date Noted   Scoliosis (and kyphoscoliosis), idiopathic 02/06/2012    Baruch Merl, PT 08/08/21 11:34 AM   [facility] [address], [place], [place], [address] Phone: [phone]   Fax:  [phone]  Name: [name] MRN: [id number] Date of Birth: 09-06-1971

## 2021-08-12 ENCOUNTER — Ambulatory Visit: Payer: 59 | Admitting: Physical Therapy

## 2021-08-12 ENCOUNTER — Other Ambulatory Visit: Payer: Self-pay

## 2021-08-12 ENCOUNTER — Encounter: Payer: Self-pay | Admitting: Physical Therapy

## 2021-08-12 DIAGNOSIS — M6281 Muscle weakness (generalized): Secondary | ICD-10-CM | POA: Diagnosis not present

## 2021-08-12 DIAGNOSIS — G8929 Other chronic pain: Secondary | ICD-10-CM

## 2021-08-12 DIAGNOSIS — M546 Pain in thoracic spine: Secondary | ICD-10-CM

## 2021-08-12 DIAGNOSIS — R2689 Other abnormalities of gait and mobility: Secondary | ICD-10-CM

## 2021-08-12 NOTE — Therapy (Signed)
[facility] [address] [place], [place], [address] Phone: [phone]   Fax:  [phone]  Physical Therapy Treatment  Patient Details  Name: [name] MRN: [id number] Date of Birth: [birth year] Referring Provider (PT): Maurice Small, MD   Encounter Date: 08/12/2021   PT End of Session - 08/12/21 1626     Visit Number 11    Date for PT Re-Evaluation 08/26/21    Authorization Type UHC    Authorization Time Period 10/20/20-09/3021    Authorization - Visit Number 11    Authorization - Number of Visits 19    PT Start Time 1430    PT Stop Time 1515    PT Time Calculation (min) 45 min    Activity Tolerance Patient tolerated treatment well    Behavior During Therapy [facility] for tasks assessed/performed             History reviewed. No pertinent past medical history.  Past Surgical History:  Procedure Laterality Date   ABDOMINAL HYSTERECTOMY     BREAST LUMPECTOMY Bilateral    HERNIA REPAIR     SPINAL FUSION     TONSILLECTOMY      There were no vitals filed for this visit.   Subjective Assessment - 08/12/21 1625     Subjective My muscles are still super angry and I'm just sick of it. My Rt shoulder feels super weak.    Pertinent History 02/14/21 spinal fusion posterior thoracic T4-L4 performed by Dr Atilano Ina of [facility], working with cardiology and started Beta Blocker to address autonomic dysfunction for tachycardia and PVCs, ongoing Lt shoulder pain, Lt UE resting tremor since surgery    Currently in Pain? Yes    Pain Score 8     Pain Location Abdomen    Pain Descriptors / Indicators Sore            Treatment: Patient seen for aquatic therapy today.  Treatment took place in water 2.5-4 feet deep depending upon activity.  Pt entered the pool via Steps step to step with moderate use of hand rails. Pt requires buoyancy of water for support and to offload joints with strengthening exercises.   Water temp 97 degrees F.  Seated water bench with 75% submersion Pt performed seated LE & UE AROM exercises 20x in all planes, concurrent discussion of pain and status.   75% depth for: Water walking with UE movements 10x in each direction. Pt able to walk faster today, creating a small current for resistance. Wall exercises inc: Bil hip flex/ext 20x circumduction 20x Bil Underwater bicycle 1 min work, 1 min rest 3x with large noodle behind pt.                               PT Short Term Goals - 07/26/21 1224       PT SHORT TERM GOAL #1   Title Pt will learn self massage and assisted massage techniques (husband can help) for improved scar and fascial mobility.    Status Achieved      PT SHORT TERM GOAL #2   Title Pt will be able to don/doff Lt shoe and sock with at least 50% greater ease due to improved Lt hip mobility.    Time 4    Period Weeks    Status On-going      PT SHORT TERM GOAL #3   Title Pt will be able to participate  in aquatic and land-based appointments with min SOB throughout session    Status Achieved      PT SHORT TERM GOAL #4   Title Reduce 5x sit to stand to </= 17 sec    Time 4    Period Weeks    Status On-going               PT Long Term Goals - 07/01/21 1227       PT LONG TERM GOAL #1   Title Pt will be able to return to work for at least half-days most days of the week without exacerbation of fatigue or pain    Time 8    Period Weeks    Status New    Target Date 08/26/21      PT LONG TERM GOAL #2   Title Pt will feel strong enough to drive short community distances to improve access to work, appointments and errands.    Time 8    Period Weeks    Status New    Target Date 08/26/21      PT LONG TERM GOAL #3   Title Pt will participate in 6' walk test covering at least 900' with min SOB and BORG RPE >/= 13 to demo improved endurance.    Baseline 3' walk test, BORG RPE 12, min SOB, covered 480', HR reached 94  bpm    Time 8    Period Weeks    Status New    Target Date 08/26/21      PT LONG TERM GOAL #4   Title Pt will be ind with advanced HEP and understand how to safely progress    Time 8    Period Weeks    Status New    Target Date 08/26/21      PT LONG TERM GOAL #5   Title Reduce 5x sit to stand to </= 13 sec to demo improved functional LE strength and endurance    Time 8    Period Weeks    Status New    Target Date 08/26/21                   Plan - 08/12/21 1628     Clinical Impression Statement Pt continues to have a lot of abdominal pain. She feels the manual work is necessary just wishes it didn't make her so sore. Pt ended up doing excellent work in the pool today: walking faster with UE movements, bicycling longer with no outward fatigue and no reports of INCREASED pain.    Personal Factors and Comorbidities Comorbidity 1;Time since onset of injury/illness/exacerbation;Transportation;Comorbidity 2    Comorbidities autonomic dysregulation, spinal fusion T4-L4    Examination-Activity Limitations Locomotion Level;Bed Mobility;Bend;Sit;Stand;Dressing    Examination-Participation Restrictions Driving;Community Activity;Cleaning;Meal Prep;Occupation    Stability/Clinical Decision Making Evolving/Moderate complexity    Rehab Potential Excellent    PT Frequency 2x / week    PT Treatment/Interventions Aquatic Therapy;ADLs/Self Care Home Management;Electrical Stimulation;Cryotherapy;Moist Heat;Neuromuscular re-education;Balance training;Therapeutic exercise;Therapeutic activities;Functional mobility training;Gait training;Patient/family education;Manual techniques;Dry needling;Scar mobilization;Passive range of motion    PT Next Visit Plan next land appointment do 5x sit to stand for STG, aquatic PT next - arm swing with contralateral step focus, mobiity, stretching, endurance    PT Home Exercise Plan Access Code: [email or web address]    Consulted and Agree with Plan of Care Patient              Patient will benefit from skilled therapeutic intervention in order to improve the  following deficits and impairments:  Abnormal gait, Decreased range of motion, Increased fascial restricitons, Impaired tone, Postural dysfunction, Decreased strength, Decreased mobility, Impaired flexibility, Hypomobility, Decreased scar mobility, Pain, Decreased activity tolerance, Cardiopulmonary status limiting activity, Decreased endurance  Visit Diagnosis: Muscle weakness (generalized)  Other abnormalities of gait and mobility  Pain in thoracic spine  Chronic bilateral low back pain without sciatica     Problem List Patient Active Problem List   Diagnosis Date Noted   Scoliosis (and kyphoscoliosis), idiopathic 02/06/2012    [name], PTA 08/12/2021, 4:31 PM  [facility] [address], [place], [place], [address] Phone: [phone]   Fax:  [phone]  Name: [name] MRN: [id number] Date of Birth: [birth year]

## 2021-08-15 ENCOUNTER — Other Ambulatory Visit: Payer: Self-pay

## 2021-08-15 ENCOUNTER — Ambulatory Visit: Payer: 59 | Admitting: Physical Therapy

## 2021-08-15 ENCOUNTER — Encounter: Payer: Self-pay | Admitting: Physical Therapy

## 2021-08-15 DIAGNOSIS — M545 Low back pain, unspecified: Secondary | ICD-10-CM

## 2021-08-15 DIAGNOSIS — M6281 Muscle weakness (generalized): Secondary | ICD-10-CM | POA: Diagnosis not present

## 2021-08-15 DIAGNOSIS — R2689 Other abnormalities of gait and mobility: Secondary | ICD-10-CM

## 2021-08-15 DIAGNOSIS — G8929 Other chronic pain: Secondary | ICD-10-CM

## 2021-08-15 DIAGNOSIS — M546 Pain in thoracic spine: Secondary | ICD-10-CM

## 2021-08-15 NOTE — Therapy (Signed)
[facility] [address] [place], [place], [address] Phone: [phone]   Fax:  [phone]  Physical Therapy Treatment  Patient Details  Name: [name] MRN: [id number] Date of Birth: [birth year] Referring Provider (PT): Maurice Small, MD   Encounter Date: 08/15/2021   PT End of Session - 08/15/21 0839     Visit Number 12    Date for PT Re-Evaluation 10/10/21    Authorization Type UHC    Authorization Time Period 10/20/20-09/3021    Authorization - Visit Number 12    Authorization - Number of Visits 19    PT Start Time 0800    PT Stop Time 0839    PT Time Calculation (min) 39 min    Activity Tolerance Patient tolerated treatment well    Behavior During Therapy [facility] for tasks assessed/performed             History reviewed. No pertinent past medical history.  Past Surgical History:  Procedure Laterality Date   ABDOMINAL HYSTERECTOMY     BREAST LUMPECTOMY Bilateral    HERNIA REPAIR     SPINAL FUSION     TONSILLECTOMY      There were no vitals filed for this visit.   Subjective Assessment - 08/15/21 0759     Subjective I am super tight.  The soft tissue work really makes everything angry.  After this week I am going to do 1x/week alternating between water and land appointments.  I am working 1/2 days 5x/week.  I go into the office 1x/week.  Overall I am improving with being able to get things off the floor and put my Lt shoe and sock on with more ease.  I am getting stronger but still so very tight.  Tightness is 10/10 in both hips and abdomen.    Pertinent History 02/14/21 spinal fusion posterior thoracic T4-L4 performed by Dr Atilano Ina of [facility], working with cardiology and started Beta Blocker to address autonomic dysfunction for tachycardia and PVCs, ongoing Lt shoulder pain, Lt UE resting tremor since surgery    How long can you walk comfortably? 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 Yes    Pain Score 10-Worst pain ever    Pain Location Hip    Pain Orientation Right;Left    Pain Descriptors / Indicators Tightness    Pain Type Surgical pain;Chronic pain    Pain Radiating Towards abdomen    Pain Onset More than a month ago    Pain Frequency Constant    Aggravating Factors  all day    Effect of Pain on Daily Activities can only work 1/2 days                [facility] PT Assessment - 08/15/21 0001       Assessment   Medical Diagnosis R29.898 (ICD-10-CM) - Other symptoms and signs involving the musculoskeletal system    Referring Provider (PT) Maurice Small, MD    Onset Date/Surgical Date 02/14/21    Hand Dominance Left    Next MD Visit working with various [facility] MDs, surgeon at [facility] left the practice    Prior Therapy not since surgery      Precautions   Precaution Comments spinal fusion T4-L4, Dr Vertell Limber says no restrictions at this point  Home Environment   Additional Comments Pt practicing driving      Prior Function   Vocation Part time employment    Vocation Requirements has been able to return to 1/2 days 5 days a week, one day a week in the office      Observation/Other Assessments   Observations central vertical scar along spine T4-L4 with improving scar mobilty lower 1/2      Strength   Overall Strength Comments bil LEs 4+/5,, bil shoulders 4/5      Palpation   Spinal mobility fusion T4-L4    Palpation comment signif myofascial tension lumbar, lateral pelvis and hips, abdomen, obliques      Ambulation/Gait   Gait Pattern Decreased arm swing - right;Decreased arm swing - left;Decreased step length - left;Decreased step length - right;Decreased trunk rotation    Gait Comments Pt walks 1 hour 5 days a week      Standardized Balance Assessment   Standardized Balance Assessment Five Times Sit to Stand    Five times sit to  stand comments  10 sec                           [facility] Adult PT Treatment/Exercise - 08/15/21 0001       Self-Care   Self-Care Other Self-Care Comments    Other Self-Care Comments  work station set up, break up static posture with frequent postural rechecks and seated ROM for neck and scapulae      Neuro Re-ed    Neuro Re-ed Details  quadruped TA 5x5 sec      Exercises   Exercises Lumbar;Shoulder;Knee/Hip      Lumbar Exercises: Stretches   Active Hamstring Stretch Left;Right;1 rep;30 seconds    Active Hamstring Stretch Limitations with strap      Lumbar Exercises: Aerobic   Nustep L3 x 6' PT presnent to discuss status      Lumbar Exercises: Supine   Other Supine Lumbar Exercises hip flexion feet on red all, frog knee position x 15      Lumbar Exercises: Quadruped   Other Quadruped Lumbar Exercises gentle rocking for hip flexion x 10 reps      Shoulder Exercises: Seated   Horizontal ABduction Strengthening;Both;15 reps;Theraband    Theraband Level (Shoulder Horizontal ABduction) Level 1 (Yellow)    External Rotation Strengthening;Both;15 reps;Theraband    Theraband Level (Shoulder External Rotation) Level 1 (Yellow)      Shoulder Exercises: ROM/Strengthening   Other ROM/Strengthening Exercises quadruped serratus push ups x 10      Shoulder Exercises: Power Tower   Extension 10 reps    Extension Limitations setted on blue ball, TC for scapular retraction/depression    Row 20 reps    Row Limitations 15lb seated on blue ball, TC for scapular depression and retraction                       PT Short Term Goals - 08/15/21 0815       PT SHORT TERM GOAL #1   Title Pt will learn self massage and assisted massage techniques (husband can help) for improved scar and fascial mobility.    Status Achieved      PT SHORT TERM GOAL #2   Title Pt will be able to don/doff Lt shoe and sock with at least 50% greater ease due to improved Lt hip mobility.     Status Achieved      PT  SHORT TERM GOAL #3   Title Pt will be able to participate in aquatic and land-based appointments with min SOB throughout session    Status Achieved      PT SHORT TERM GOAL #4   Title Reduce 5x sit to stand to </= 17 sec    Baseline 10 sec    Status Achieved               PT Long Term Goals - 08/15/21 0815       PT LONG TERM GOAL #1   Title Pt will be able to return to work for at least half-days most days of the week without exacerbation of fatigue or pain    Baseline working 1/2 days with only 1 day a week in the office, the day she goes into office is very fatiguing    Status On-going      PT LONG TERM GOAL #2   Title Pt will feel strong enough to drive short community distances to improve access to work, appointments and errands.    Baseline practicing driving [place] the country before doing community driving    Status On-going      PT LONG TERM GOAL #3   Title Pt will participate in 6' walk test covering at least 900' with min SOB and BORG RPE >/= 13 to demo improved endurance.    Baseline walking 1 hour 5x/week without dyspnea    Status Achieved      PT LONG TERM GOAL #4   Title Pt will be ind with advanced HEP and understand how to safely progress    Status On-going      PT LONG TERM GOAL #5   Title Reduce 5x sit to stand to </= 13 sec to demo improved functional LE strength and endurance    Baseline 10 sec    Status Achieved                   Plan - 08/15/21 0839     Clinical Impression Statement Pt is making steady progress with post-op strength and funcitonal mobility.  She has ongoing trunk and limb hypertonicity and UE tremor since fusion.  She is walking 1 hour 5x/week and has returned to working 1/2 days, only tolerating going into the office 1x/week due to increased movement demands.  She wishes to tolerate more in-office work days and return to full time status.  She has not returned to driving but is practicing  driving out in the country to see if she would feel comfortable driving in busier community.  She is very motivated and compliant with HEP.  She has met all STGs and reports feeling stronger in LEs, greater ease with dressing and is now able to get objects from the floor.  5x sit to stand was 10 sec today compared to 20 sec at eval.  LEs are 4+/5 for strength but Pt has ongoing weakness in bil shoulders 4/5.  Pt will taper to 1x week after this week with alternating pool and land based visits.    Comorbidities autonomic dysregulation, spinal fusion T4-L4    Examination-Activity Limitations Locomotion Level;Bed Mobility;Bend;Sit;Stand;Dressing    Examination-Participation Restrictions Driving;Community Activity;Cleaning;Meal Prep;Occupation    Stability/Clinical Decision Making Evolving/Moderate complexity    Clinical Decision Making Moderate    Rehab Potential Excellent    PT Frequency 2x / week   after this week will taper to 1x/week alt land and water appts   PT Duration 8 weeks  PT Treatment/Interventions Aquatic Therapy;ADLs/Self Care Home Management;Electrical Stimulation;Cryotherapy;Moist Heat;Neuromuscular re-education;Balance training;Therapeutic exercise;Therapeutic activities;Functional mobility training;Gait training;Patient/family education;Manual techniques;Dry needling;Scar mobilization;Passive range of motion    PT Next Visit Plan continue shoulder strength, mobility, core and trunk strength    PT Home Exercise Plan Access Code: [email or web address]    Consulted and Agree with Plan of Care Patient             Patient will benefit from skilled therapeutic intervention in order to improve the following deficits and impairments:  Abnormal gait, Decreased range of motion, Increased fascial restricitons, Impaired tone, Postural dysfunction, Decreased strength, Decreased mobility, Impaired flexibility, Hypomobility, Decreased scar mobility, Pain, Decreased activity tolerance, Cardiopulmonary  status limiting activity, Decreased endurance  Visit Diagnosis: Muscle weakness (generalized) - Plan: PT plan of care cert/re-cert  Other abnormalities of gait and mobility - Plan: PT plan of care cert/re-cert  Pain in thoracic spine - Plan: PT plan of care cert/re-cert  Chronic bilateral low back pain without sciatica - Plan: PT plan of care cert/re-cert     Problem List Patient Active Problem List   Diagnosis Date Noted   Scoliosis (and kyphoscoliosis), idiopathic 02/06/2012    Baruch Merl, PT 08/15/21 8:45 AM   [facility] [address], [place], [place], [address] Phone: [phone]   Fax:  [phone]  Name: [name] MRN: [id number] Date of Birth: 1971-02-15

## 2021-08-19 ENCOUNTER — Other Ambulatory Visit: Payer: Self-pay

## 2021-08-19 ENCOUNTER — Ambulatory Visit: Payer: 59 | Admitting: Physical Therapy

## 2021-08-19 DIAGNOSIS — R2689 Other abnormalities of gait and mobility: Secondary | ICD-10-CM

## 2021-08-19 DIAGNOSIS — M546 Pain in thoracic spine: Secondary | ICD-10-CM

## 2021-08-19 DIAGNOSIS — M6281 Muscle weakness (generalized): Secondary | ICD-10-CM | POA: Diagnosis not present

## 2021-08-19 DIAGNOSIS — G8929 Other chronic pain: Secondary | ICD-10-CM

## 2021-08-19 NOTE — Therapy (Signed)
[facility] [address], [place], [place], [address] Phone: [phone]   Fax:  [phone]  Physical Therapy Treatment  Patient Details  Name: [name] MRN: [id number] Date of Birth: 05/28/1971 Referring Provider (PT): Maurice Small, MD   Encounter Date: 08/19/2021   PT End of Session - 08/19/21 1246     Visit Number 13    Date for PT Re-Evaluation 10/10/21    Authorization Type UHC    Authorization Time Period 10/20/20-09/3021    Authorization - Visit Number 13    Authorization - Number of Visits 19    PT Start Time 4081    PT Stop Time 1340    PT Time Calculation (min) 54 min    Activity Tolerance Patient tolerated treatment well    Behavior During Therapy [facility] for tasks assessed/performed             No past medical history on file.  Past Surgical History:  Procedure Laterality Date   ABDOMINAL HYSTERECTOMY     BREAST LUMPECTOMY Bilateral    HERNIA REPAIR     SPINAL FUSION     TONSILLECTOMY      There were no vitals filed for this visit.   Subjective Assessment - 08/19/21 1424     Subjective No pain right now, just my usual tightness. I actually shaved my whole leg this week!    Patient is accompained by: Family member    Pertinent History 02/14/21 spinal fusion posterior thoracic T4-L4 performed by Dr Atilano Ina of [facility], working with cardiology and started Beta Blocker to address autonomic dysfunction for tachycardia and PVCs, ongoing Lt shoulder pain, Lt UE resting tremor since surgery    Currently in Pain? No/denies             Treatment: Patient seen for aquatic therapy today.  Treatment took place in water 2.5-4 feet deep depending upon activity.  Pt entered the pool via staris, step to step with mild use of rails. Water temp 94 degrees F. Pt requires buoyancy of water for support and to offload joints with strengthening exercises.    Seated water bench with 75%  submersion Pt performed seated LE AROM exercises 20x in all planes, concurrent discussion of current status and pain.  75% submersion: Water walking 10x all four directions, pt able to generate good current for resistance. Shoulder: small triangle wts for horizontal abd/add 2 min, mitten hands for flex extension 20x with complete submersion. Wall exercises with and without UE support inc: 15x Bil heel raises, hip circumduction, flex/ext, High knee marching 8x across pool with small noodle for support.   Underwater seated decompression float f/b 1 min bicycle. 4 bouts of sequence. MAnual lumbar soft tissue work with complete submersion.                              PT Short Term Goals - 08/15/21 0815       PT SHORT TERM GOAL #1   Title Pt will learn self massage and assisted massage techniques (husband can help) for improved scar and fascial mobility.    Status Achieved      PT SHORT TERM GOAL #2   Title Pt will be able to don/doff Lt shoe and sock with at least 50% greater ease due to improved Lt hip mobility.    Status Achieved      PT SHORT TERM GOAL #3  Title Pt will be able to participate in aquatic and land-based appointments with min SOB throughout session    Status Achieved      PT SHORT TERM GOAL #4   Title Reduce 5x sit to stand to </= 17 sec    Baseline 10 sec    Status Achieved               PT Long Term Goals - 08/15/21 0815       PT LONG TERM GOAL #1   Title Pt will be able to return to work for at least half-days most days of the week without exacerbation of fatigue or pain    Baseline working 1/2 days with only 1 day a week in the office, the day she goes into office is very fatiguing    Status On-going      PT LONG TERM GOAL #2   Title Pt will feel strong enough to drive short community distances to improve access to work, appointments and errands.    Baseline practicing driving out in the country before doing community driving     Status On-going      PT LONG TERM GOAL #3   Title Pt will participate in 6' walk test covering at least 900' with min SOB and BORG RPE >/= 13 to demo improved endurance.    Baseline walking 1 hour 5x/week without dyspnea    Status Achieved      PT LONG TERM GOAL #4   Title Pt will be ind with advanced HEP and understand how to safely progress    Status On-going      PT LONG TERM GOAL #5   Title Reduce 5x sit to stand to </= 13 sec to demo improved functional LE strength and endurance    Baseline 10 sec    Status Achieved                   Plan - 08/19/21 1425     Clinical Impression Statement Pt arrives for aquatic pt with no pain to report but she does have her "tightness as usual." Pt reports Lt shoulder was not aggrevated with water exercises last week, we continued to focus on LT shoulder strength, LE AROM and strength today. No adverse effects or pain while exercising in the water.    Comorbidities autonomic dysregulation, spinal fusion T4-L4    Examination-Activity Limitations Locomotion Level;Bed Mobility;Bend;Sit;Stand;Dressing    Examination-Participation Restrictions Driving;Community Activity;Cleaning;Meal Prep;Occupation    Stability/Clinical Decision Making Evolving/Moderate complexity    Rehab Potential Excellent    PT Frequency 2x / week    PT Duration 8 weeks    PT Treatment/Interventions Aquatic Therapy;ADLs/Self Care Home Management;Electrical Stimulation;Cryotherapy;Moist Heat;Neuromuscular re-education;Balance training;Therapeutic exercise;Therapeutic activities;Functional mobility training;Gait training;Patient/family education;Manual techniques;Dry needling;Scar mobilization;Passive range of motion    PT Next Visit Plan continue shoulder strength, mobility, core and trunk strength    PT Home Exercise Plan Access Code: [email or web address]    Consulted and Agree with Plan of Care Patient             Patient will benefit from skilled therapeutic intervention  in order to improve the following deficits and impairments:  Abnormal gait, Decreased range of motion, Increased fascial restricitons, Impaired tone, Postural dysfunction, Decreased strength, Decreased mobility, Impaired flexibility, Hypomobility, Decreased scar mobility, Pain, Decreased activity tolerance, Cardiopulmonary status limiting activity, Decreased endurance  Visit Diagnosis: Muscle weakness (generalized)  Pain in thoracic spine  Other abnormalities of gait and mobility  Chronic bilateral low  back pain without sciatica     Problem List Patient Active Problem List   Diagnosis Date Noted   Scoliosis (and kyphoscoliosis), idiopathic 02/06/2012    Panhia Karl, PTA 08/19/2021, 2:30 PM  [facility] [address] [place], [place], [address] Phone: [phone]   Fax:  [phone]  Name: [name] MRN: [id number] Date of Birth: [birth year]

## 2021-08-24 ENCOUNTER — Other Ambulatory Visit: Payer: Self-pay

## 2021-08-24 ENCOUNTER — Ambulatory Visit: Payer: 59 | Attending: Family Medicine | Admitting: Physical Therapy

## 2021-08-24 ENCOUNTER — Encounter: Payer: Self-pay | Admitting: Physical Therapy

## 2021-08-24 DIAGNOSIS — R2689 Other abnormalities of gait and mobility: Secondary | ICD-10-CM | POA: Insufficient documentation

## 2021-08-24 DIAGNOSIS — M546 Pain in thoracic spine: Secondary | ICD-10-CM | POA: Diagnosis present

## 2021-08-24 DIAGNOSIS — G8929 Other chronic pain: Secondary | ICD-10-CM | POA: Insufficient documentation

## 2021-08-24 DIAGNOSIS — M6281 Muscle weakness (generalized): Secondary | ICD-10-CM | POA: Insufficient documentation

## 2021-08-24 DIAGNOSIS — M545 Low back pain, unspecified: Secondary | ICD-10-CM | POA: Diagnosis present

## 2021-08-24 NOTE — Therapy (Signed)
[facility] @ [place], [place], [address] Phone:     Fax:     Physical Therapy Treatment  Patient Details  Name: [name] MRN: [id number] Date of Birth: 12-Jun-1971 Referring Provider (PT): Maurice Small, MD   Encounter Date: 08/24/2021   PT End of Session - 08/24/21 0907     Visit Number 14    Date for PT Re-Evaluation 10/10/21    Authorization Type UHC    Authorization Time Period 10/20/20-09/3021    Authorization - Visit Number 14    Authorization - Number of Visits 14    PT Start Time 0800    PT Stop Time 6144    PT Time Calculation (min) 55 min    Activity Tolerance Patient tolerated treatment well    Behavior During Therapy [facility] for tasks assessed/performed             History reviewed. No pertinent past medical history.  Past Surgical History:  Procedure Laterality Date   ABDOMINAL HYSTERECTOMY     BREAST LUMPECTOMY Bilateral    HERNIA REPAIR     SPINAL FUSION     TONSILLECTOMY      There were no vitals filed for this visit.   Subjective Assessment - 08/24/21 0905     Subjective I see Dr Vertell Limber today. Still tight 24/7. I have picked up more hours at work each day and I am doing more independent driving now.    Pertinent History 02/14/21 spinal fusion posterior thoracic T4-L4 performed by Dr Atilano Ina of [facility], working with cardiology and started Beta Blocker to address autonomic dysfunction for tachycardia and PVCs, ongoing Lt shoulder pain, Lt UE resting tremor since surgery    Currently in Pain? --   Tight "all over"            Treatment: Patient seen for aquatic therapy today.  Treatment took place in water 2.5-4 feet deep depending upon activity.  Pt entered the pool via stairs, step to step with mild use of rails.  Water temp 94 degrees F. Pt requires buoyancy of water for support and to offload joints with strengthening exercises.    Seated water bench with 75%  submersion Pt performed seated LE AROM exercises 20x in all planes, lateral rib breathing with Pilates breath 10x eyes closed. Pain assessment.  Standing 75% depth for: Water walking 10x with small noodle push & pull with UE. Mitten hands for side stepping, VC to squat deeper and perform jumping jack arms with 100%submersion Wall exercises: Bil 20x for hip 3 ways, circumduction, heel raises High knee marching with small noodle for postural support across pool 10x Small triangle UE weights for shoulder horizontal add/abd 20x Seated decompression with large noodle behind patient. 1 min decompression, 1 min underwater bicycle 4 bouts, ending on a hang.                            PT Education - 08/24/21 0906     Education Details The use of weighted heating pads to incorporae with breathing exercises each night    Person(s) Educated Patient    Methods Explanation    Comprehension Verbalized understanding              PT Short Term Goals - 08/15/21 0815       PT SHORT TERM GOAL #1   Title Pt will learn self massage and assisted  massage techniques (husband can help) for improved scar and fascial mobility.    Status Achieved      PT SHORT TERM GOAL #2   Title Pt will be able to don/doff Lt shoe and sock with at least 50% greater ease due to improved Lt hip mobility.    Status Achieved      PT SHORT TERM GOAL #3   Title Pt will be able to participate in aquatic and land-based appointments with min SOB throughout session    Status Achieved      PT SHORT TERM GOAL #4   Title Reduce 5x sit to stand to </= 17 sec    Baseline 10 sec    Status Achieved               PT Long Term Goals - 08/15/21 0815       PT LONG TERM GOAL #1   Title Pt will be able to return to work for at least half-days most days of the week without exacerbation of fatigue or pain    Baseline working 1/2 days with only 1 day a week in the office, the day she goes into office is  very fatiguing    Status On-going      PT LONG TERM GOAL #2   Title Pt will feel strong enough to drive short community distances to improve access to work, appointments and errands.    Baseline practicing driving [place] in the country before doing community driving    Status On-going      PT LONG TERM GOAL #3   Title Pt will participate in 6' walk test covering at least 900' with min SOB and BORG RPE >/= 13 to demo improved endurance.    Baseline walking 1 hour 5x/week without dyspnea    Status Achieved      PT LONG TERM GOAL #4   Title Pt will be ind with advanced HEP and understand how to safely progress    Status On-going      PT LONG TERM GOAL #5   Title Reduce 5x sit to stand to </= 13 sec to demo improved functional LE strength and endurance    Baseline 10 sec    Status Achieved                   Plan - 08/24/21 0908     Clinical Impression Statement Pt arrives today with usual complaints of systemic muscular tightness particularly trunk & hips. Pt will Dr Vertell Limber today. pt drove independently today to aquatic PT and reports adding more hours to her work day every day of the week. She purchased a zero gravity chair which she can lay on during lunch. Pt continues to excel in the pool, having no pain but does "feel her muscles and they do get some tighter from exercising them." Pt feels the decompression hang helps to stretch her soft tissues really well but ina gentle way. pt was able to increase her work up to 20 reps on everything today with no adverse effects.    Personal Factors and Comorbidities Comorbidity 1;Time since onset of injury/illness/exacerbation;Transportation;Comorbidity 2    Comorbidities autonomic dysregulation, spinal fusion T4-L4    Examination-Activity Limitations Locomotion Level;Bed Mobility;Bend;Sit;Stand;Dressing    Examination-Participation Restrictions Driving;Community Activity;Cleaning;Meal Prep;Occupation    Stability/Clinical Decision Making  Evolving/Moderate complexity    Rehab Potential Excellent    PT Frequency 2x / week    PT Duration 8 weeks    PT Treatment/Interventions  Aquatic Therapy;ADLs/Self Care Home Management;Electrical Stimulation;Cryotherapy;Moist Heat;Neuromuscular re-education;Balance training;Therapeutic exercise;Therapeutic activities;Functional mobility training;Gait training;Patient/family education;Manual techniques;Dry needling;Scar mobilization;Passive range of motion    PT Next Visit Plan continue shoulder strength, mobility, core and trunk strength, see what MD says    PT Home Exercise Plan Access Code: [email or web address]    Consulted and Agree with Plan of Care Patient             Patient will benefit from skilled therapeutic intervention in order to improve the following deficits and impairments:  Abnormal gait, Decreased range of motion, Increased fascial restricitons, Impaired tone, Postural dysfunction, Decreased strength, Decreased mobility, Impaired flexibility, Hypomobility, Decreased scar mobility, Pain, Decreased activity tolerance, Cardiopulmonary status limiting activity, Decreased endurance  Visit Diagnosis: Muscle weakness (generalized)  Pain in thoracic spine  Other abnormalities of gait and mobility  Chronic bilateral low back pain without sciatica     Problem List Patient Active Problem List   Diagnosis Date Noted   Scoliosis (and kyphoscoliosis), idiopathic 02/06/2012    Telma Pyeatt, PTA 08/24/2021, 9:12 AM  [facility] @ [place], [place], [address] Phone:     Fax:     Name: Felise Georgia MRN: [id number] Date of Birth: Mar 08, 1971

## 2021-08-30 ENCOUNTER — Other Ambulatory Visit: Payer: Self-pay

## 2021-08-30 ENCOUNTER — Encounter: Payer: Self-pay | Admitting: Physical Therapy

## 2021-08-30 ENCOUNTER — Ambulatory Visit: Payer: 59 | Admitting: Physical Therapy

## 2021-08-30 DIAGNOSIS — G8929 Other chronic pain: Secondary | ICD-10-CM

## 2021-08-30 DIAGNOSIS — M6281 Muscle weakness (generalized): Secondary | ICD-10-CM

## 2021-08-30 DIAGNOSIS — M546 Pain in thoracic spine: Secondary | ICD-10-CM

## 2021-08-30 DIAGNOSIS — M545 Low back pain, unspecified: Secondary | ICD-10-CM

## 2021-08-30 DIAGNOSIS — R2689 Other abnormalities of gait and mobility: Secondary | ICD-10-CM

## 2021-08-30 NOTE — Therapy (Signed)
[facility] @ [place], [place], [address] Phone: [phone]   Fax:  [phone]  Physical Therapy Treatment  Patient Details  Name: [name] MRN: [id number] Date of Birth: [birth year] Referring Provider (PT): Maurice Small, MD   Encounter Date: 08/30/2021   PT End of Session - 08/30/21 0800     Visit Number 15    Date for PT Re-Evaluation 10/10/21    Authorization Type UHC    Authorization Time Period 10/20/20-09/3021    Authorization - Visit Number 15    PT Start Time 0800    PT Stop Time 0845    PT Time Calculation (min) 45 min    Activity Tolerance Patient tolerated treatment well    Behavior During Therapy [facility] for tasks assessed/performed             History reviewed. No pertinent past medical history.  Past Surgical History:  Procedure Laterality Date   ABDOMINAL HYSTERECTOMY     BREAST LUMPECTOMY Bilateral    HERNIA REPAIR     SPINAL FUSION     TONSILLECTOMY      There were no vitals filed for this visit.   Subjective Assessment - 08/30/21 0801     Subjective Dr. Vertell Limber prescribed a new muscle relaxor and it really "messed me up". It increased her tone to where she could hardly do anything on her own on Sunday. The neurologist has not called yet for appt.    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 Yes    Pain Score 8     Pain Location Hip    Pain Orientation Right    Pain Descriptors / Indicators Tightness                               [facility] Adult PT Treatment/Exercise -  08/30/21 0001       Lumbar Exercises: Aerobic   Nustep L3 x 5' PT presnent to discuss status      Knee/Hip Exercises: Stretches   Hip Flexor Stretch Right;2 reps;60 seconds    Hip Flexor Stretch Limitations off EOB; seated demonstrated for pt      Manual Therapy   Manual Therapy Soft tissue mobilization;Myofascial release    Soft tissue mobilization IASTM to right lumbar, lateral trunk, obliques, upper gluteals and along right diaphragm with metal tools and silicone cup; left diaphragm with silicone cup only, but not tolerated long.    Myofascial Release right iliopsoas release in hooklying                     PT Education - 08/30/21 1204     Education Details HEP progressed    Person(s) Educated Patient    Methods Explanation;Demonstration;Handout    Comprehension Verbalized understanding;Returned demonstration              PT Short Term Goals - 08/15/21 0815       PT SHORT TERM GOAL #1   Title Pt will learn self massage and assisted  massage techniques (husband can help) for improved scar and fascial mobility.    Status Achieved      PT SHORT TERM GOAL #2   Title Pt will be able to don/doff Lt shoe and sock with at least 50% greater ease due to improved Lt hip mobility.    Status Achieved      PT SHORT TERM GOAL #3   Title Pt will be able to participate in aquatic and land-based appointments with min SOB throughout session    Status Achieved      PT SHORT TERM GOAL #4   Title Reduce 5x sit to stand to </= 17 sec    Baseline 10 sec    Status Achieved               PT Long Term Goals - 08/15/21 0815       PT LONG TERM GOAL #1   Title Pt will be able to return to work for at least half-days most days of the week without exacerbation of fatigue or pain    Baseline working 1/2 days with only 1 day a week in the office, the day she goes into office is very fatiguing    Status On-going      PT LONG TERM GOAL #2   Title Pt will feel strong enough  to drive short community distances to improve access to work, appointments and errands.    Baseline practicing driving out in the country before doing community driving    Status On-going      PT LONG TERM GOAL #3   Title Pt will participate in 6' walk test covering at least 900' with min SOB and BORG RPE >/= 13 to demo improved endurance.    Baseline walking 1 hour 5x/week without dyspnea    Status Achieved      PT LONG TERM GOAL #4   Title Pt will be ind with advanced HEP and understand how to safely progress    Status On-going      PT LONG TERM GOAL #5   Title Reduce 5x sit to stand to </= 13 sec to demo improved functional LE strength and endurance    Baseline 10 sec    Status Achieved                   Plan - 08/30/21 1158     Clinical Impression Statement Pt presents with reports of significant flare up of sx since taking newly prescribed muscle relaxor. She stopped the meds but reports increased pain, tone and functional decline since the weekend. She had to have her husband drive her to PT and work today. Due to this we worked primarily on STM and MFR. She tolerated IASTM with [place] tools and silicone cup very well to right lumbar, obliques and ant hip, but she was unable to tolerate much on the left side. She also tolerated psoas release on the right in hooklying without complaint. Hip flexor stretch of EOB and in sitting was issued to HEP as patient had good response to these. Pt discussed possible accupuncture.    PT Treatment/Interventions Aquatic Therapy;ADLs/Self Care Home Management;Electrical Stimulation;Cryotherapy;Moist Heat;Neuromuscular re-education;Balance training;Therapeutic exercise;Therapeutic activities;Functional mobility training;Gait training;Patient/family education;Manual techniques;Dry needling;Scar mobilization;Passive range of motion    PT Next Visit Plan Assess response to STM/MFR and new stretches; continue shoulder strength, mobility, core and  trunk strength    PT Home Exercise Plan Access Code: [email or web address]    Consulted and Agree with Plan of  Care Patient             Patient will benefit from skilled therapeutic intervention in order to improve the following deficits and impairments:  Abnormal gait, Decreased range of motion, Increased fascial restricitons, Impaired tone, Postural dysfunction, Decreased strength, Decreased mobility, Impaired flexibility, Hypomobility, Decreased scar mobility, Pain, Decreased activity tolerance, Cardiopulmonary status limiting activity, Decreased endurance  Visit Diagnosis: Muscle weakness (generalized)  Pain in thoracic spine  Other abnormalities of gait and mobility  Chronic bilateral low back pain without sciatica     Problem List Patient Active Problem List   Diagnosis Date Noted   Scoliosis (and kyphoscoliosis), idiopathic 02/06/2012    Madelyn Flavors, PT 08/30/2021, 12:10 PM  [facility] @ [place], [place], [address] Phone: [phone]   Fax:  [phone]  Name: [name] MRN: [id number] Date of Birth: January 25, 1971

## 2021-08-30 NOTE — Patient Instructions (Signed)
Access Code: [email or web address] URL: [email or web address] Date: 08/30/2021 Prepared by: Almyra Free  Exercises Supine Figure 4 Piriformis Stretch - 1 x daily - 7 x weekly - 1 sets - 3 reps - 20 hold Hooklying Single Knee to Chest Stretch - 1 x daily - 7 x weekly - 1 sets - 3 reps - 20 hold Supine Piriformis Stretch Pulling Heel to Hip - 1 x daily - 7 x weekly - 1 sets - 3 reps - 20 hold Sit to Stand - 1 x daily - 7 x weekly - 2 sets - 10 reps Seated Long Arc Quad - 1 x daily - 7 x weekly - 2 sets - 10 reps Seated Hip Abduction with Resistance - 1 x daily - 7 x weekly - 2 sets - 10 reps Seated High Shoulder Row with Anchored Resistance - 1 x daily - 7 x weekly - 2 sets - 10 reps Seated Shoulder Extension and Scapular Retraction with Resistance - 1 x daily - 7 x weekly - 2 sets - 10 reps Standing Plank on Wall - 1 x daily - 7 x weekly - 1 sets - 5 reps - 10 hold Standing Single Arm Shoulder Flexion Stretch on Wall - 1 x daily - 7 x weekly - 1 sets - 3 reps - 10 hold Standing Lumbar Spine Flexion Stretch Counter - 1 x daily - 7 x weekly - 1 sets - 3 reps - 10 hold Standing Hip Abduction with Counter Support - 1 x daily - 7 x weekly - 3 sets - 5 reps Single Leg Balance in March Position - 1 x daily - 7 x weekly - 2 sets - 20 reps Supine Hip and Knee Flexion AROM with Swiss Ball - 1 x daily - 7 x weekly - 1 sets - 10 reps Supine Piriformis Stretch with Swiss Ball - 1 x daily - 7 x weekly - 1 sets - 3 reps Hooklying Isometric Hip Flexion - 1 x daily - 7 x weekly - 1 sets - 5 reps Quadruped Rocking Slow - 1 x daily - 7 x weekly - 1 sets - 10 reps Modified Thomas Stretch - 1 x daily - 7 x weekly - 1 sets - 3 reps - 60 sec hold Seated Hip Flexor Stretch - 2 x daily - 7 x weekly - 3 reps - 1 sets - 30-60 sec hold

## 2021-09-09 ENCOUNTER — Other Ambulatory Visit: Payer: Self-pay

## 2021-09-09 ENCOUNTER — Encounter: Payer: Self-pay | Admitting: Physical Therapy

## 2021-09-09 ENCOUNTER — Ambulatory Visit: Payer: 59 | Admitting: Physical Therapy

## 2021-09-09 DIAGNOSIS — M545 Low back pain, unspecified: Secondary | ICD-10-CM

## 2021-09-09 DIAGNOSIS — M6281 Muscle weakness (generalized): Secondary | ICD-10-CM | POA: Diagnosis not present

## 2021-09-09 DIAGNOSIS — G8929 Other chronic pain: Secondary | ICD-10-CM

## 2021-09-09 DIAGNOSIS — M546 Pain in thoracic spine: Secondary | ICD-10-CM

## 2021-09-09 NOTE — Therapy (Signed)
[facility] @ [place], [place], [address] Phone: [phone]   Fax:  [phone]  Physical Therapy Treatment  Patient Details  Name: Abbygale Lapid MRN: [id number] Date of Birth: [birth year] Referring Provider (PT): Maurice Small, MD   Encounter Date: 09/09/2021   PT End of Session - 09/09/21 1629     Visit Number 16    Date for PT Re-Evaluation 10/10/21    Authorization Type UHC    Authorization Time Period 10/20/20-09/3021    PT Start Time 1429    PT Stop Time 1510    PT Time Calculation (min) 41 min    Activity Tolerance Patient tolerated treatment well    Behavior During Therapy [facility] for tasks assessed/performed             History reviewed. No pertinent past medical history.  Past Surgical History:  Procedure Laterality Date   ABDOMINAL HYSTERECTOMY     BREAST LUMPECTOMY Bilateral    HERNIA REPAIR     SPINAL FUSION     TONSILLECTOMY      There were no vitals filed for this visit.   Subjective Assessment - 09/09/21 1625     Subjective I have visit with [facility] scheduled. I am driving independently pretty much 100% of the time. Still at office 3x week. I still get very fatigued. Shoulders seem to be doing a little better, don't really do any exercises during the week. I plan to on joining the gym after PT is done so I can go in the pool on the weekends. I want to learn some machines where I can keep strengthening my arms.    Currently in Pain? Yes    Pain Score 6     Pain Location Back    Pain Descriptors / Indicators Tightness;Sore    Aggravating Factors  Constant    Pain Relieving Factors Medications              Treatment: Patient seen for aquatic therapy today.  Treatment took place in water 2.5-4 feet deep depending upon activity.  Pt entered the pool via stairs: step to step with mild use of hand rails. Pt requires buoyancy of water for support and to offload  joints with strengthening exercises.  Water temp 94 degrees F.  Seated water bench with 75% submersion Pt performed seated LE AROM exercises 20x in all planes, concurrent pain assessment/discussion of current status.   75% depth: Water walking 10x in each direction with large noodle push& pull.High knee marching without flotation 6 lengths. Shoulder flex/ext 2x10 with mitten hands. UE weights for shoulder abd/add 2x10, shoulder ER/IR 2x10, Wall exercises for LE AROM & strength 20x Bil, fascial flossing at stairs: ankle DF/PF 20x Bil and then with LE in adduction.                             PT Short Term Goals - 08/15/21 0815       PT SHORT TERM GOAL #1   Title Pt will learn self massage and assisted massage techniques (husband can help) for improved scar and fascial mobility.    Status Achieved      PT SHORT TERM GOAL #2   Title Pt will be able to don/doff Lt shoe and sock with at least 50% greater ease due to improved Lt hip mobility.    Status Achieved      PT SHORT  TERM GOAL #3   Title Pt will be able to participate in aquatic and land-based appointments with min SOB throughout session    Status Achieved      PT SHORT TERM GOAL #4   Title Reduce 5x sit to stand to </= 17 sec    Baseline 10 sec    Status Achieved               PT Long Term Goals - 08/15/21 0815       PT LONG TERM GOAL #1   Title Pt will be able to return to work for at least half-days most days of the week without exacerbation of fatigue or pain    Baseline working 1/2 days with only 1 day a week in the office, the day she goes into office is very fatiguing    Status On-going      PT LONG TERM GOAL #2   Title Pt will feel strong enough to drive short community distances to improve access to work, appointments and errands.    Baseline practicing driving out in the country before doing community driving    Status On-going      PT LONG TERM GOAL #3   Title Pt will participate  in 6' walk test covering at least 900' with min SOB and BORG RPE >/= 13 to demo improved endurance.    Baseline walking 1 hour 5x/week without dyspnea    Status Achieved      PT LONG TERM GOAL #4   Title Pt will be ind with advanced HEP and understand how to safely progress    Status On-going      PT LONG TERM GOAL #5   Title Reduce 5x sit to stand to </= 13 sec to demo improved functional LE strength and endurance    Baseline 10 sec    Status Achieved                   Plan - 09/09/21 1631     Clinical Impression Statement Pt returns to the pool today. Pt was able to maintain the previous level of work, feeling most comfortable with her current work load and no more. Pt is now driving independently 100% of the time and continues to work at the office 3x week. No pain increases in the pool or outward signs of fatigue.    Personal Factors and Comorbidities Comorbidity 1;Time since onset of injury/illness/exacerbation;Transportation;Comorbidity 2    Comorbidities autonomic dysregulation, spinal fusion T4-L4    Examination-Activity Limitations Locomotion Level;Bed Mobility;Bend;Sit;Stand;Dressing    Examination-Participation Restrictions Driving;Community Activity;Cleaning;Meal Prep;Occupation    Stability/Clinical Decision Making Evolving/Moderate complexity    Rehab Potential Excellent    PT Frequency 2x / week    PT Duration 8 weeks    PT Treatment/Interventions Aquatic Therapy;ADLs/Self Care Home Management;Electrical Stimulation;Cryotherapy;Moist Heat;Neuromuscular re-education;Balance training;Therapeutic exercise;Therapeutic activities;Functional mobility training;Gait training;Patient/family education;Manual techniques;Dry needling;Scar mobilization;Passive range of motion    PT Next Visit Plan ERO next session: pt is interested in learning gym equipment that will help strengthen her UE as she plans to eventually transition to [place].    PT Home Exercise Plan Access Code:  [email or web address]    Consulted and Agree with Plan of Care Patient             Patient will benefit from skilled therapeutic intervention in order to improve the following deficits and impairments:  Abnormal gait, Decreased range of motion, Increased fascial restricitons, Impaired tone, Postural dysfunction, Decreased strength, Decreased mobility,  Impaired flexibility, Hypomobility, Decreased scar mobility, Pain, Decreased activity tolerance, Cardiopulmonary status limiting activity, Decreased endurance  Visit Diagnosis: Muscle weakness (generalized)  Pain in thoracic spine  Chronic bilateral low back pain without sciatica     Problem List Patient Active Problem List   Diagnosis Date Noted   Scoliosis (and kyphoscoliosis), idiopathic 02/06/2012    Nissi Doffing, PTA 09/09/2021, 4:35 PM  [facility] @ [place], [place], [address] Phone: [phone]   Fax:  [phone]  Name: Daissy Yerian MRN: [id number] Date of Birth: Dec 02, 1970

## 2021-09-13 ENCOUNTER — Ambulatory Visit: Payer: 59

## 2021-09-13 ENCOUNTER — Other Ambulatory Visit: Payer: Self-pay

## 2021-09-13 DIAGNOSIS — M6281 Muscle weakness (generalized): Secondary | ICD-10-CM | POA: Diagnosis not present

## 2021-09-13 DIAGNOSIS — R2689 Other abnormalities of gait and mobility: Secondary | ICD-10-CM

## 2021-09-13 DIAGNOSIS — M546 Pain in thoracic spine: Secondary | ICD-10-CM

## 2021-09-13 DIAGNOSIS — G8929 Other chronic pain: Secondary | ICD-10-CM

## 2021-09-13 NOTE — Therapy (Signed)
[facility] @ [place], [place], [address] Phone: [phone]   Fax:  [phone]  Physical Therapy Treatment  Patient Details  Name: [name] MRN: [id number] Date of Birth: 1971/01/07 Referring Provider (PT): Maurice Small, MD   Encounter Date: 09/13/2021   PT End of Session - 09/13/21 0847     Visit Number 17    Date for PT Re-Evaluation 10/10/21    Authorization Type UHC    Authorization Time Period 10/20/20-09/3021    PT Start Time 0804    PT Stop Time 0843    PT Time Calculation (min) 39 min    Activity Tolerance Patient tolerated treatment well    Behavior During Therapy [facility] for tasks assessed/performed             History reviewed. No pertinent past medical history.  Past Surgical History:  Procedure Laterality Date   ABDOMINAL HYSTERECTOMY     BREAST LUMPECTOMY Bilateral    HERNIA REPAIR     SPINAL FUSION     TONSILLECTOMY      There were no vitals filed for this visit.   Subjective Assessment - 09/13/21 0807     Subjective I had a rough weekend due to my husband not being home.  I was very stiff.  I am going into the office 3 days a week.    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 Yes    Pain Score 7     Pain Location Shoulder    Pain Orientation Right    Pain Descriptors / Indicators Tightness;Sore    Pain Onset More than a month ago    Pain Frequency Constant    Aggravating Factors  constant, movement    Pain Relieving Factors medication                               [facility] Adult PT Treatment/Exercise - 09/13/21 0001       Lumbar Exercises: Stretches   Active Hamstring Stretch Left;Right;2 reps;20 seconds      Lumbar Exercises: Aerobic   Nustep L3 x 6' PT presnent to discuss status      Lumbar Exercises: Supine   Bridge 10 reps     Bridge Limitations 2x5      Knee/Hip Exercises: Standing   Hip Flexion Stengthening;Both;2 sets;10 reps;Knee bent    Hip Abduction Stengthening;Both;2 sets;10 reps    Abduction Limitations verbal cues for core activation      Knee/Hip Exercises: Supine   Hip Adduction Isometric Strengthening;Both;20 reps    Bridges Strengthening;1 set;5 sets      Shoulder Exercises: Seated   Horizontal ABduction Strengthening;Both;15 reps;Theraband    Theraband Level (Shoulder Horizontal ABduction) Level 1 (Yellow)    External Rotation Strengthening;Both;15 reps;Theraband    Theraband Level (Shoulder External Rotation) Level 1 (Yellow)                       PT Short Term Goals - 08/15/21 0815       PT SHORT TERM GOAL #1   Title Pt will learn self massage and assisted massage techniques (husband can help) for improved scar and fascial mobility.    Status Achieved      PT SHORT TERM GOAL #2  Title Pt will be able to don/doff Lt shoe and sock with at least 50% greater ease due to improved Lt hip mobility.    Status Achieved      PT SHORT TERM GOAL #3   Title Pt will be able to participate in aquatic and land-based appointments with min SOB throughout session    Status Achieved      PT SHORT TERM GOAL #4   Title Reduce 5x sit to stand to </= 17 sec    Baseline 10 sec    Status Achieved               PT Long Term Goals - 08/15/21 0815       PT LONG TERM GOAL #1   Title Pt will be able to return to work for at least half-days most days of the week without exacerbation of fatigue or pain    Baseline working 1/2 days with only 1 day a week in the office, the day she goes into office is very fatiguing    Status On-going      PT LONG TERM GOAL #2   Title Pt will feel strong enough to drive short community distances to improve access to work, appointments and errands.    Baseline practicing driving out in the country before doing community driving    Status On-going       PT LONG TERM GOAL #3   Title Pt will participate in 6' walk test covering at least 900' with min SOB and BORG RPE >/= 13 to demo improved endurance.    Baseline walking 1 hour 5x/week without dyspnea    Status Achieved      PT LONG TERM GOAL #4   Title Pt will be ind with advanced HEP and understand how to safely progress    Status On-going      PT LONG TERM GOAL #5   Title Reduce 5x sit to stand to </= 13 sec to demo improved functional LE strength and endurance    Baseline 10 sec    Status Achieved                   Plan - 09/13/21 0846     Clinical Impression Statement Pt is working 3 days a week and this is fatiguing for this pt.  She was very stiff and fatigued over the weekend due to her husband being out of town.  Pt was able to participate in gentle exercise in the clinic and was monitored throughout for fatigue and discomfort.  Pt will continue to benefit from both land based and aquatic PT to address fatigue and functional limitations after spinal surgery with complications.    PT Frequency 2x / week    PT Duration 8 weeks    PT Treatment/Interventions Aquatic Therapy;ADLs/Self Care Home Management;Electrical Stimulation;Cryotherapy;Moist Heat;Neuromuscular re-education;Balance training;Therapeutic exercise;Therapeutic activities;Functional mobility training;Gait training;Patient/family education;Manual techniques;Dry needling;Scar mobilization;Passive range of motion    PT Next Visit Plan continue aquatics and land based exercise as tolerated    PT Home Exercise Plan Access Code: [email or web address]    Consulted and Agree with Plan of Care Patient             Patient will benefit from skilled therapeutic intervention in order to improve the following deficits and impairments:  Abnormal gait, Decreased range of motion, Increased fascial restricitons, Impaired tone, Postural dysfunction, Decreased strength, Decreased mobility, Impaired flexibility, Hypomobility, Decreased scar  mobility, Pain, Decreased activity tolerance, Cardiopulmonary status limiting activity,  Decreased endurance  Visit Diagnosis: Chronic bilateral low back pain without sciatica  Other abnormalities of gait and mobility  Pain in thoracic spine  Muscle weakness (generalized)     Problem List Patient Active Problem List   Diagnosis Date Noted   Scoliosis (and kyphoscoliosis), idiopathic 02/06/2012   Sigurd Sos, PT 09/13/21 8:56 AM   [facility] @ [place], [place], [address] Phone: [phone]   Fax:  [phone]  Name: [name] MRN: [id number] Date of Birth: [birth year]

## 2021-09-21 ENCOUNTER — Encounter: Payer: Self-pay | Admitting: Physical Therapy

## 2021-09-21 ENCOUNTER — Other Ambulatory Visit: Payer: Self-pay

## 2021-09-21 ENCOUNTER — Ambulatory Visit: Payer: 59 | Attending: Family Medicine | Admitting: Physical Therapy

## 2021-09-21 DIAGNOSIS — M546 Pain in thoracic spine: Secondary | ICD-10-CM | POA: Insufficient documentation

## 2021-09-21 DIAGNOSIS — R2689 Other abnormalities of gait and mobility: Secondary | ICD-10-CM | POA: Insufficient documentation

## 2021-09-21 DIAGNOSIS — M6281 Muscle weakness (generalized): Secondary | ICD-10-CM | POA: Insufficient documentation

## 2021-09-21 DIAGNOSIS — G8929 Other chronic pain: Secondary | ICD-10-CM | POA: Insufficient documentation

## 2021-09-21 DIAGNOSIS — M545 Low back pain, unspecified: Secondary | ICD-10-CM | POA: Insufficient documentation

## 2021-09-21 NOTE — Therapy (Signed)
[facility] @ [place], [place], [address] Phone: [phone]   Fax:  [phone]  Physical Therapy Treatment  Patient Details  Name: [name] MRN: [id number] Date of Birth: [birth year] Referring Provider (PT): Maurice Small, MD   Encounter Date: 09/21/2021   PT End of Session - 09/21/21 0802     Visit Number 18    Date for PT Re-Evaluation 10/10/21    Authorization Type UHC    Authorization Time Period 10/20/20-09/3021    PT Start Time 0802    PT Stop Time 0846    PT Time Calculation (min) 44 min    Activity Tolerance Patient tolerated treatment well    Behavior During Therapy [facility] for tasks assessed/performed             History reviewed. No pertinent past medical history.  Past Surgical History:  Procedure Laterality Date   ABDOMINAL HYSTERECTOMY     BREAST LUMPECTOMY Bilateral    HERNIA REPAIR     SPINAL FUSION     TONSILLECTOMY      There were no vitals filed for this visit.   Subjective Assessment - 09/21/21 1014     Subjective Trying to wrok in the office more hours, I am usually "dead" when I get home. i have to modifiy my exercises/walking as I am working longer hours. I do think I am recovering faster now. Pain still limits me.    Pertinent History 02/14/21 spinal fusion posterior thoracic T4-L4 performed by Dr Atilano Ina of [facility], working with cardiology and started Beta Blocker to address autonomic dysfunction for tachycardia and PVCs, ongoing Lt shoulder pain, Lt UE resting tremor since surgery    Currently in Pain? --   General trunk, hip, pelvis soreness but nothing more than usual.           Treatment: Patient seen for aquatic therapy today.  Treatment took place in water 2.5-4 feet deep depending upon activity.  Pt entered the pool via stairs, mild use of rails. Water temp 94 degrees F. Pt requires buoyancy of water for support and to offload joints with  strengthening exercises.  Pt utilizes viscosity of the water required for strengthening.   Seated water bench with 75% submersion Pt performed seated LE AROM exercises 20x in all planes, pain assessment and current status discussed.   75% submersion for the following: water walking with large noodle for UE push/pull 10x forward/backward. Pt walking faster. Side stepping with jumping jack arms and mitten hands 10x. Small noodle static abdominal compression with marching 20x: alternating hip swings, abduction and circles 20x each with VC to push & pull as pt feels is suitable. Seated decompression with large noodle behind patient. 2 min, then underwater bicycle 1 min: 4 bouts.                               PT Short Term Goals - 08/15/21 0815       PT SHORT TERM GOAL #1   Title Pt will learn self massage and assisted massage techniques (husband can help) for improved scar and fascial mobility.    Status Achieved      PT SHORT TERM GOAL #2   Title Pt will be able to don/doff Lt shoe and sock with at least 50% greater ease due to improved Lt hip mobility.    Status Achieved      PT SHORT  TERM GOAL #3   Title Pt will be able to participate in aquatic and land-based appointments with min SOB throughout session    Status Achieved      PT SHORT TERM GOAL #4   Title Reduce 5x sit to stand to </= 17 sec    Baseline 10 sec    Status Achieved               PT Long Term Goals - 08/15/21 0815       PT LONG TERM GOAL #1   Title Pt will be able to return to work for at least half-days most days of the week without exacerbation of fatigue or pain    Baseline working 1/2 days with only 1 day a week in the office, the day she goes into office is very fatiguing    Status On-going      PT LONG TERM GOAL #2   Title Pt will feel strong enough to drive short community distances to improve access to work, appointments and errands.    Baseline practicing driving out in  the country before doing community driving    Status On-going      PT LONG TERM GOAL #3   Title Pt will participate in 6' walk test covering at least 900' with min SOB and BORG RPE >/= 13 to demo improved endurance.    Baseline walking 1 hour 5x/week without dyspnea    Status Achieved      PT LONG TERM GOAL #4   Title Pt will be ind with advanced HEP and understand how to safely progress    Status On-going      PT LONG TERM GOAL #5   Title Reduce 5x sit to stand to </= 13 sec to demo improved functional LE strength and endurance    Baseline 10 sec    Status Achieved                   Plan - 09/21/21 1016     Clinical Impression Statement Pt reports she is trying to increase her hours worked in the office. This has been very difficult both pain wise and immense fatigue by the end of her day. Pt does report she seems to recover faster than previous. Doing HEP is challenging as she increases her work hours but pt reports she is trying to do her best to get as much exercise and walking in as possible. No adverse issuesexercising in the water today. Pt was able to increase her pace of exercise today.    Personal Factors and Comorbidities Comorbidity 1;Time since onset of injury/illness/exacerbation;Transportation;Comorbidity 2    Comorbidities autonomic dysregulation, spinal fusion T4-L4    Examination-Activity Limitations Locomotion Level;Bed Mobility;Bend;Sit;Stand;Dressing    Examination-Participation Restrictions Driving;Community Activity;Cleaning;Meal Prep;Occupation    Stability/Clinical Decision Making Evolving/Moderate complexity    Rehab Potential Excellent    PT Duration 8 weeks    PT Treatment/Interventions Aquatic Therapy;ADLs/Self Care Home Management;Electrical Stimulation;Cryotherapy;Moist Heat;Neuromuscular re-education;Balance training;Therapeutic exercise;Therapeutic activities;Functional mobility training;Gait training;Patient/family education;Manual  techniques;Dry needling;Scar mobilization;Passive range of motion    PT Next Visit Plan Re-assessment upcoming visit    PT Home Exercise Plan Access Code: [email or web address]             Patient will benefit from skilled therapeutic intervention in order to improve the following deficits and impairments:  Abnormal gait, Decreased range of motion, Increased fascial restricitons, Impaired tone, Postural dysfunction, Decreased strength, Decreased mobility, Impaired flexibility, Hypomobility, Decreased scar mobility, Pain, Decreased activity  tolerance, Cardiopulmonary status limiting activity, Decreased endurance  Visit Diagnosis: Chronic bilateral low back pain without sciatica  Other abnormalities of gait and mobility  Pain in thoracic spine  Muscle weakness (generalized)     Problem List Patient Active Problem List   Diagnosis Date Noted   Scoliosis (and kyphoscoliosis), idiopathic 02/06/2012    Sawsan Riggio, PTA 09/21/2021, 3:55 PM  [facility] @ [place], [place], [address] Phone: [phone]   Fax:  [phone]  Name: [name] MRN: [id number] Date of Birth: Mar 26, 1971

## 2021-09-27 ENCOUNTER — Encounter: Payer: 59 | Admitting: Physical Therapy

## 2021-09-28 ENCOUNTER — Ambulatory Visit: Payer: 59

## 2021-09-28 ENCOUNTER — Other Ambulatory Visit: Payer: Self-pay

## 2021-09-28 DIAGNOSIS — G8929 Other chronic pain: Secondary | ICD-10-CM

## 2021-09-28 DIAGNOSIS — M545 Low back pain, unspecified: Secondary | ICD-10-CM

## 2021-09-28 DIAGNOSIS — M546 Pain in thoracic spine: Secondary | ICD-10-CM

## 2021-09-28 DIAGNOSIS — R2689 Other abnormalities of gait and mobility: Secondary | ICD-10-CM | POA: Diagnosis present

## 2021-09-28 DIAGNOSIS — M6281 Muscle weakness (generalized): Secondary | ICD-10-CM

## 2021-09-28 NOTE — Therapy (Signed)
[facility] @ [place], [place], [address] Phone: [phone]   Fax:  [phone]  Physical Therapy Treatment  Patient Details  Name: [name] MRN: [id number] Date of Birth: 29-Aug-1971 Referring Provider (PT): Maurice Small, MD   Encounter Date: 09/28/2021   PT End of Session - 09/28/21 0848     Visit Number 19    Date for PT Re-Evaluation 11/23/21    Authorization Type UHC    Authorization Time Period insurance rolls over 10/20/21    PT Start Time 0803    PT Stop Time 0847    PT Time Calculation (min) 44 min    Activity Tolerance Patient tolerated treatment well    Behavior During Therapy [facility] for tasks assessed/performed             History reviewed. No pertinent past medical history.  Past Surgical History:  Procedure Laterality Date   ABDOMINAL HYSTERECTOMY     BREAST LUMPECTOMY Bilateral    HERNIA REPAIR     SPINAL FUSION     TONSILLECTOMY      There were no vitals filed for this visit.   Subjective Assessment - 09/28/21 0806     Subjective I've been working 32 hours and it is really brutal.  It is pretty much all that I can do, I can't get anything done at home.    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 Yes    Pain Score --   not pain-tightness now.  by the end of the day 8/10   Pain Location Back    Pain Orientation Right;Left;Lower    Pain Descriptors / Indicators Tightness;Sore                [facility] PT Assessment - 09/28/21 0001       Assessment   Medical Diagnosis R29.898 (ICD-10-CM) -  Other symptoms and signs involving the musculoskeletal system    Referring Provider (PT) Maurice Small, MD    Onset Date/Surgical Date 02/14/21    Next MD Visit working with various [facility] MDs, surgeon at [facility] left the practice    Prior Therapy not since surgery      Precautions   Precaution Comments spinal fusion T4-L4, Dr Vertell Limber says no restrictions at this point      [place] residence    Living Arrangements Spouse/significant other    Type of [place] Two level      Prior Function   Level of Independence Independent with basic ADLs;Other (comment);Independent with transfers    Vocation Full time employment    Vocation Requirements Pt is working 30-32 hours a week      Observation/Other Assessments   Observations central vertical scar along spine T4-L4 with improving scar mobilty lower 1/2      Strength   Overall Strength Comments bil LEs 4+/5, bil shoulders 4+/5 except ER is 4/5      Palpation   Spinal mobility fusion T4-L4  [facility] Adult PT Treatment/Exercise - 09/28/21 0001       Lumbar Exercises: Stretches   Active Hamstring Stretch Left;Right;2 reps;20 seconds      Lumbar Exercises: Aerobic   Nustep L3 x 6' PT presnent to discuss status      Lumbar Exercises: Supine   Bridge 10 reps    Bridge Limitations 2x5                       PT Short Term Goals - 08/15/21 0815       PT SHORT TERM GOAL #1   Title Pt will learn self massage and assisted massage techniques (husband can help) for improved scar and fascial mobility.    Status Achieved      PT SHORT TERM GOAL #2   Title Pt will be able to don/doff Lt shoe and sock with at least 50% greater ease due to improved Lt hip mobility.    Status Achieved      PT SHORT TERM GOAL #3   Title Pt will be able to participate in aquatic and land-based appointments with min SOB throughout session    Status Achieved       PT SHORT TERM GOAL #4   Title Reduce 5x sit to stand to </= 17 sec    Baseline 10 sec    Status Achieved               PT Long Term Goals - 09/28/21 0840       PT LONG TERM GOAL #1   Title Pt will be able to return to work for at least half-days most days of the week without exacerbation of fatigue or pain    Baseline working 30-32 hours- 8/10 in the evenings and fatigue that is significant    Time 8    Period Weeks    Status On-going    Target Date 11/23/21      PT LONG TERM GOAL #2   Title Pt will feel strong enough to drive [place] distances (>30 minutes) to improve access to work, appointments and errands.    Baseline short distances now    Time 8    Period Weeks    Status Achieved      PT LONG TERM GOAL #3   Title Pt will participate in 6' walk test covering at least 900' with min SOB and BORG RPE >/= 13 to demo improved endurance.    Status Achieved      PT LONG TERM GOAL #4   Title Pt will be ind with advanced HEP and understand how to safely progress    Time 8    Period Weeks    Status New    Target Date 11/23/21      PT LONG TERM GOAL #5   Title Reduce 5x sit to stand to </= 13 sec to demo improved functional LE strength and endurance    Baseline 8 seconds    Status Achieved      Additional Long Term Goals   Additional Long Term Goals Yes      PT LONG TERM GOAL #6   Title improve LE strength and endurance to perform short community activity up to 30 minutes with independence    Time 8    Period Weeks    Status New    Target Date 11/23/21                   Plan -  09/28/21 0831     Clinical Impression Statement Pt reports she is trying to increase her hours worked in the office and this is significantly challenging due to fatigue and pain.  Pt does report she seems to recover faster than previous. Doing HEP is challenging as she increases her work hours but pt reports she is trying to do her best to get as much exercise and walking  in as possible.  Pt will have a 2 week lapse due to insurance and she plans to go to [place] to exercise. Pt with improved UE strength with MMT.  PT discussed counseling services and provided pt with information.  Pt expressed depression due to her condition and has worked with a Social worker in the past. No adverse issues exercising on land today.  Pt with slow progress toward goals due to complexity of medical condition.    Personal Factors and Comorbidities Comorbidity 1;Time since onset of injury/illness/exacerbation;Transportation;Comorbidity 2    Examination-Participation Restrictions Driving;Community Activity;Cleaning;Meal Prep;Occupation    Rehab Potential Good    PT Frequency 2x / week    PT Duration 8 weeks    PT Treatment/Interventions Aquatic Therapy;ADLs/Self Care Home Management;Electrical Stimulation;Cryotherapy;Moist Heat;Neuromuscular re-education;Balance training;Therapeutic exercise;Therapeutic activities;Functional mobility training;Gait training;Patient/family education;Manual techniques;Dry needling;Scar mobilization;Passive range of motion    PT Next Visit Plan Pt will take a break until early december due to insurance benefits.  Pt will resume pool and land based PT in December    PT Home Exercise Plan Access Code: [email or web address]    Consulted and Agree with Plan of Care Patient             Patient will benefit from skilled therapeutic intervention in order to improve the following deficits and impairments:  Abnormal gait, Decreased range of motion, Increased fascial restricitons, Impaired tone, Postural dysfunction, Decreased strength, Decreased mobility, Impaired flexibility, Hypomobility, Decreased scar mobility, Pain, Decreased activity tolerance, Cardiopulmonary status limiting activity, Decreased endurance  Visit Diagnosis: Chronic bilateral low back pain without sciatica - Plan: PT plan of care cert/re-cert  Other abnormalities of gait and mobility - Plan: PT plan of care  cert/re-cert  Pain in thoracic spine - Plan: PT plan of care cert/re-cert  Muscle weakness (generalized) - Plan: PT plan of care cert/re-cert     Problem List Patient Active Problem List   Diagnosis Date Noted   Scoliosis (and kyphoscoliosis), idiopathic 02/06/2012   Sigurd Sos, PT 09/28/21 8:53 AM   [facility] @ [place], [place], [address] Phone: [phone]   Fax:  [phone]  Name: [name] MRN: [id number] Date of Birth: May 14, 1971

## 2021-09-30 ENCOUNTER — Ambulatory Visit: Payer: 59 | Admitting: Cardiology

## 2021-10-11 NOTE — Progress Notes (Signed)
Cardiology Office Note:    Date:  10/12/2021   ID:  [name], DOB 1971/06/24, MRN [id number]  PCP:  Merryl Hacker, No  Cardiologist:  Shirlee More, MD    Referring MD: Maurice Small, MD    ASSESSMENT:    1. Sinus tachycardia   2. Frequent PVCs   3. Hypotension, unspecified hypotension type   4. SOB (shortness of breath)    PLAN:    In order of problems listed above:  Her primary problem is autonomic dysfunction with sinus tachycardia and hypotension.  I suspect is due to extensive spine surgery and deconditioning.  She was seen by EP and was not felt to have inappropriate tachycardia or POTS syndrome.  She will continue her beta-blocker but I will have her take it twice a day and opt out if she does not think she needs a second dose for tachycardia and PVCs and increasing midodrine to 10 mg twice daily and asked to take the first dose about 30 minutes before she gets out of bed.  I think giving [facility] physical therapy has been the most beneficial she is followed up with neurosurgery requests referrals to pulmonary and to primary care she also has been seen by neurosurgeon.   Next appointment: 3 months   Medication Adjustments/Labs and Tests Ordered: Current medicines are reviewed at length with the patient today.  Concerns regarding medicines are outlined above.  No orders of the defined types were placed in this encounter.  No orders of the defined types were placed in this encounter.   Chief Complaint  Patient presents with   Follow-up   Tachycardia    And frequent PVCs    History of Present Illness:    [name] is a 50 y.o. female with a hx of marked sinus tachycardia following extensive spine surgery and frequent PVCs last seen 06/24/2021 improved on beta-blocker.  Echocardiogram performed 06/08/2021 showed mildly diminished ejection fraction 45 to 50% with global hypokinesia normal right ventricular size function and pulmonary artery pressure she  has mild mitral regurgitation trivial aortic regurgitation and had frequent PVCs throughout the test   I independently reviewed her echocardiogram diastolic function pulmonary veins are normal. Frequent PVCs are present and I feel left ventricular systolic function is normal EF at least in the range of 50 to 55% calculated at 50%.  I would not find this abnormal.   She subsequently used an event monitor showing frequent ventricular ectopy with couplets triplets and one 4 beat run of PVCs  Compliance with diet, lifestyle and medications: Yes  Overall she is improved Less rapid heartbeat and she is quite aware of it in the evenings and has to take her beta-blocker twice a Blood pressure tends to be lower in the morning with numbers ranging 87/62-90 5/67 we will increase the dose of the midodrine I asked her to take the first dose 30 minutes before getting out of bed With physical therapy her strength and endurance are better she remains short of breath and I think his neuromuscular and she is going to be seeing a neurologist she tells me she is can ask for nerve conduction EMGs and requests referral to pulmonary. Not having chest pain she has had 2 episodes of brief loss of consciousness shifting posture without trauma.  History reviewed. No pertinent past medical history.  Past Surgical History:  Procedure Laterality Date   ABDOMINAL HYSTERECTOMY     BREAST LUMPECTOMY Bilateral    HERNIA REPAIR  SPINAL FUSION     TONSILLECTOMY      Current Medications: Current Meds  Medication Sig   acebutolol (SECTRAL) 200 MG capsule Take 1 capsule (200 mg total) by mouth daily.   acetaminophen (TYLENOL) 650 MG CR tablet Take 1,300 mg by mouth every 8 (eight) hours as needed for pain.   ascorbic acid (VITAMIN C) 1000 MG tablet Take 1,000 mg by mouth daily.   B Complex Vitamins (VITAMIN B COMPLEX PO) Take 1 tablet by mouth daily.   CALCIUM PO Take 1,000 mg by mouth daily.   Cholecalciferol (VITAMIN  D3 PO) Take 1 tablet by mouth daily.   diazepam (VALIUM) 5 MG tablet Take 5 mg by mouth in the morning, at noon, and at bedtime.   diphenhydrAMINE (SOMINEX) 25 MG tablet Take 25 mg by mouth at bedtime as needed for sleep.   Ibuprofen 200 MG CAPS Take 2 capsules by mouth every 6 (six) hours as needed for pain.   magnesium oxide (MAG-OX) 400 MG tablet Take 400 mg by mouth daily.   midodrine (PROAMATINE) 5 MG tablet Take 1 tablet (5 mg total) by mouth 2 (two) times daily with a meal.   Probiotic Product (PROBIOTIC DAILY PO) Take 1 capsule by mouth daily.     Allergies:   Ciprofloxacin, Fluoxetine, Robaxin [methocarbamol], Sertraline, and Venlafaxine   Social History   Socioeconomic History   Marital status: Married    Spouse name: Not on file   Number of children: Not on file   Years of education: Not on file   Highest education level: Not on file  Occupational History   Not on file  Tobacco Use   Smoking status: Never   Smokeless tobacco: Never  Vaping Use   Vaping Use: Never used  Substance and Sexual Activity   Alcohol use: No   Drug use: No   Sexual activity: Not on file  Other Topics Concern   Not on file  Social History Narrative   Not on file   Social Determinants of Health   Financial Resource Strain: Not on file  Food Insecurity: Not on file  Transportation Needs: Not on file  Physical Activity: Not on file  Stress: Not on file  Social Connections: Not on file     Family History: The patient's family history includes Autoimmune disease in her mother; Breast cancer in her paternal grandmother; Diabetes in her brother; Heart disease in her father, maternal grandfather, maternal grandmother, and paternal grandfather; Scoliosis in her mother; Suicidality in her brother. ROS:   Please see the history of present illness.    All other systems reviewed and are negative.  EKGs/Labs/Other Studies Reviewed:    The following studies were reviewed today:  Recent  Labs: 03/25/2021: ALT 21; BUN 13; Creatinine, Ser 0.68; Hemoglobin 13.1; Platelets 338; Potassium 3.6; Sodium 135  Recent Lipid Panel No results found for: CHOL, TRIG, HDL, CHOLHDL, VLDL, LDLCALC, LDLDIRECT  Physical Exam:    VS:  BP 136/84   Pulse 84   Ht 5\' 6"  (1.676 m)   Wt 130 lb (59 kg)   SpO2 98%   BMI 20.98 kg/m     Wt Readings from Last 3 Encounters:  10/12/21 130 lb (59 kg)  06/24/21 129 lb (58.5 kg)  06/20/21 129 lb 3.2 oz (58.6 kg)     GEN: She looks frail she is tremulous looks markedly improved from before seen in my office well nourished, in no acute distress HEENT: Normal NECK: No JVD; No  carotid bruits LYMPHATICS: No lymphadenopathy CARDIAC: RRR, no murmurs, rubs, gallops RESPIRATORY:  Clear to auscultation without rales, wheezing or rhonchi  ABDOMEN: Soft, non-tender, non-distended MUSCULOSKELETAL:  No edema; No deformity  SKIN: Warm and dry NEUROLOGIC:  Alert and oriented x 3 PSYCHIATRIC:  Normal affect    Signed, Shirlee More, MD  10/12/2021 9:09 AM    [place]

## 2021-10-12 ENCOUNTER — Telehealth: Payer: Self-pay | Admitting: Pulmonary Disease

## 2021-10-12 ENCOUNTER — Ambulatory Visit (INDEPENDENT_AMBULATORY_CARE_PROVIDER_SITE_OTHER): Payer: 59 | Admitting: Cardiology

## 2021-10-12 ENCOUNTER — Encounter: Payer: Self-pay | Admitting: Cardiology

## 2021-10-12 ENCOUNTER — Other Ambulatory Visit: Payer: Self-pay

## 2021-10-12 VITALS — BP 136/84 | HR 84 | Ht 66.0 in | Wt 130.0 lb

## 2021-10-12 DIAGNOSIS — R0602 Shortness of breath: Secondary | ICD-10-CM | POA: Diagnosis not present

## 2021-10-12 DIAGNOSIS — R000 Tachycardia, unspecified: Secondary | ICD-10-CM

## 2021-10-12 DIAGNOSIS — I493 Ventricular premature depolarization: Secondary | ICD-10-CM | POA: Diagnosis not present

## 2021-10-12 DIAGNOSIS — I959 Hypotension, unspecified: Secondary | ICD-10-CM | POA: Diagnosis not present

## 2021-10-12 MED ORDER — ACEBUTOLOL HCL 200 MG PO CAPS: 200.0000 mg | ORAL_CAPSULE | Freq: Two times a day (BID) | ORAL | 3 refills | Status: DC

## 2021-10-12 MED ORDER — MIDODRINE HCL 10 MG PO TABS: 10.0000 mg | ORAL_TABLET | Freq: Two times a day (BID) | ORAL | 3 refills | Status: AC

## 2021-10-12 NOTE — Telephone Encounter (Signed)
Patient scheduled 11/01/21 at 1100 with Dr. Vaughan Browner for pulmonary consult.

## 2021-10-12 NOTE — Patient Instructions (Signed)
Medication Instructions:  Your physician has recommended you make the following change in your medication:  INCREASE: Midodrine 10 mg take one tablet by mouth twice daily. We recommend taking this early in the morning and early afternoon.  INCREASE: Acebutolol 200 mg take one tablet by mouth twice daily.  *If you need a refill on your cardiac medications before your next appointment, please call your pharmacy*   Lab Work: Your physician recommends that you return for lab work in: TODAY BMP, Mag, TSH, CBC If you have labs (blood work) drawn today and your tests are completely normal, you will receive your results only by: MyChart Message (if you have MyChart) OR A paper copy in the mail If you have any lab test that is abnormal or we need to change your treatment, we will call you to review the results.   Testing/Procedures: None   Follow-Up: At [facility], you and your health needs are our priority.  As part of our continuing mission to provide you with exceptional heart care, we have created designated Provider Care Teams.  These Care Teams include your primary Cardiologist (physician) and Advanced Practice Providers (APPs -  Physician Assistants and Nurse Practitioners) who all work together to provide you with the care you need, when you need it.  We recommend signing up for the patient portal called "MyChart".  Sign up information is provided on this After Visit Summary.  MyChart is used to connect with patients for Virtual Visits (Telemedicine).  Patients are able to view lab/test results, encounter notes, upcoming appointments, etc.  Non-urgent messages can be sent to your provider as well.   To learn more about what you can do with MyChart, go to [email or web address].    Your next appointment:   3 month(s)  The format for your next appointment:   In Person  Provider:   Shirlee More, MD    Other Instructions

## 2021-10-12 NOTE — Telephone Encounter (Signed)
Message received this morning from Dr. Vaughan Browner.   Dr. Bettina Gavia requested Patient have pulmonary consult with Dr. Vaughan Browner for shortness of breath.   I have left messages on home and cell numbers with Patient to call back to schedule.  Dr. Vaughan Browner has openings next week. Will follow up.

## 2021-10-13 LAB — BASIC METABOLIC PANEL
BUN/Creatinine Ratio: 30 — ABNORMAL HIGH (ref 9–23)
BUN: 22 mg/dL (ref 6–24)
CO2: 28 mmol/L (ref 20–29)
Calcium: 9.9 mg/dL (ref 8.7–10.2)
Chloride: 99 mmol/L (ref 96–106)
Creatinine, Ser: 0.74 mg/dL (ref 0.57–1.00)
Glucose: 91 mg/dL (ref 70–99)
Potassium: 5.2 mmol/L (ref 3.5–5.2)
Sodium: 139 mmol/L (ref 134–144)
eGFR: 98 mL/min/{1.73_m2} (ref >59)

## 2021-10-13 LAB — CBC
Hematocrit: 39.5 % (ref 34.0–46.6)
Hemoglobin: 13.1 g/dL (ref 11.1–15.9)
MCH: 32.3 pg (ref 26.6–33.0)
MCHC: 33.2 g/dL (ref 31.5–35.7)
MCV: 97 fL (ref 79–97)
Platelets: 223 10*3/uL (ref 150–450)
RBC: 4.06 x10E6/uL (ref 3.77–5.28)
RDW: 12.2 % (ref 11.7–15.4)
WBC: 6.2 10*3/uL (ref 3.4–10.8)

## 2021-10-13 LAB — TSH: TSH: 1.03 u[IU]/mL (ref 0.450–4.500)

## 2021-10-13 LAB — MAGNESIUM: Magnesium: 2.3 mg/dL (ref 1.6–2.3)

## 2021-10-21 ENCOUNTER — Ambulatory Visit: Payer: 59 | Attending: Family Medicine | Admitting: Physical Therapy

## 2021-10-21 ENCOUNTER — Encounter: Payer: Self-pay | Admitting: Physical Therapy

## 2021-10-21 ENCOUNTER — Other Ambulatory Visit: Payer: Self-pay

## 2021-10-21 DIAGNOSIS — G8929 Other chronic pain: Secondary | ICD-10-CM | POA: Diagnosis present

## 2021-10-21 DIAGNOSIS — R2689 Other abnormalities of gait and mobility: Secondary | ICD-10-CM | POA: Diagnosis present

## 2021-10-21 DIAGNOSIS — M545 Low back pain, unspecified: Secondary | ICD-10-CM | POA: Diagnosis not present

## 2021-10-21 DIAGNOSIS — M6281 Muscle weakness (generalized): Secondary | ICD-10-CM | POA: Insufficient documentation

## 2021-10-21 DIAGNOSIS — M546 Pain in thoracic spine: Secondary | ICD-10-CM | POA: Insufficient documentation

## 2021-10-21 NOTE — Therapy (Signed)
[facility] @ [place], [place], [address] Phone: [phone]   Fax:  [phone]  Physical Therapy Treatment  Patient Details  Name: [name] MRN: [id number] Date of Birth: 1971-11-01 Referring Provider (PT): Maurice Small, MD   Encounter Date: 10/21/2021   PT End of Session - 10/21/21 1230     Visit Number 20    Date for PT Re-Evaluation 11/23/21    Authorization Type UHC    Authorization Time Period insurance rolls over 10/20/21    PT Start Time 1230    PT Stop Time 1330    PT Time Calculation (min) 60 min    Activity Tolerance Patient tolerated treatment well;Patient limited by fatigue;Patient limited by pain    Behavior During Therapy [facility] for tasks assessed/performed             History reviewed. No pertinent past medical history.  Past Surgical History:  Procedure Laterality Date   ABDOMINAL HYSTERECTOMY     BREAST LUMPECTOMY Bilateral    HERNIA REPAIR     SPINAL FUSION     TONSILLECTOMY      There were no vitals filed for this visit.    Treatment: Patient seen for aquatic therapy today.  Treatment took place in water 2.5-4 feet deep depending upon activity.  Pt entered the pool via stairs, slowly with heavy use of hand rails. Water temp 94 degrees F. Pt requires buoyancy of water for support and to offload joints with strengthening exercises.    Seated water bench with 75% submersion Pt performed seated LE AROM exercises 20x in all planes, pain assessment and status update concurrent.   Seated decompression with large noodle behind patient. PTA providing later sway for trunk mobilization. Intermittent LE AROM   75% submersion for forward and backwards water walking with noodle UE push & pull 6x each. F/B decompression float to manage fatigue.  Bil hip circumduction 10x. UE shoulder flex/ext with hand paddles 2x10 Pt was able to sit on stairs and use jets to hit Lt scapula area  of pain. This was helpful per pt.                              PT Short Term Goals - 08/15/21 0815       PT SHORT TERM GOAL #1   Title Pt will learn self massage and assisted massage techniques (husband can help) for improved scar and fascial mobility.    Status Achieved      PT SHORT TERM GOAL #2   Title Pt will be able to don/doff Lt shoe and sock with at least 50% greater ease due to improved Lt hip mobility.    Status Achieved      PT SHORT TERM GOAL #3   Title Pt will be able to participate in aquatic and land-based appointments with min SOB throughout session    Status Achieved      PT SHORT TERM GOAL #4   Title Reduce 5x sit to stand to </= 17 sec    Baseline 10 sec    Status Achieved               PT Long Term Goals - 09/28/21 0840       PT LONG TERM GOAL #1   Title Pt will be able to return to work for at least half-days most days of the week without exacerbation of  fatigue or pain    Baseline working 30-32 hours- 8/10 in the evenings and fatigue that is significant    Time 8    Period Weeks    Status On-going    Target Date 11/23/21      PT LONG TERM GOAL #2   Title Pt will feel strong enough to drive [place] distances (>30 minutes) to improve access to work, appointments and errands.    Baseline short distances now    Time 8    Period Weeks    Status Achieved      PT LONG TERM GOAL #3   Title Pt will participate in 6' walk test covering at least 900' with min SOB and BORG RPE >/= 13 to demo improved endurance.    Status Achieved      PT LONG TERM GOAL #4   Title Pt will be ind with advanced HEP and understand how to safely progress    Time 8    Period Weeks    Status New    Target Date 11/23/21      PT LONG TERM GOAL #5   Title Reduce 5x sit to stand to </= 13 sec to demo improved functional LE strength and endurance    Baseline 8 seconds    Status Achieved      Additional Long Term Goals   Additional Long  Term Goals Yes      PT LONG TERM GOAL #6   Title improve LE strength and endurance to perform short community activity up to 30 minutes with independence    Time 8    Period Weeks    Status New    Target Date 11/23/21                   Plan - 10/21/21 1232     Clinical Impression Statement Pt returns to aquatic Pt after having to wait for her insurance to roll over. Pt did remarkably well while in the water. Pt did participate in frequent and lengthy dewcpomression floats to attempt to downregulate her system and provide some stretch to her lower half. Breathing and heart rate were stable. Pt was able to ambulate with decent pace and perfrom UE strengthening exercises with the hand paddles for resisatnce.    Personal Factors and Comorbidities Comorbidity 1;Time since onset of injury/illness/exacerbation;Transportation;Comorbidity 2    Comorbidities autonomic dysregulation, spinal fusion T4-L4    Examination-Activity Limitations Locomotion Level;Bed Mobility;Bend;Sit;Stand;Dressing    Stability/Clinical Decision Making Evolving/Moderate complexity    Rehab Potential Good    PT Frequency 2x / week    PT Duration 8 weeks    PT Treatment/Interventions Aquatic Therapy;ADLs/Self Care Home Management;Electrical Stimulation;Cryotherapy;Moist Heat;Neuromuscular re-education;Balance training;Therapeutic exercise;Therapeutic activities;Functional mobility training;Gait training;Patient/family education;Manual techniques;Dry needling;Scar mobilization;Passive range of motion    PT Next Visit Plan See how pt did in pool, land based next session    PT Home Exercise Plan Access Code: [email or web address]    Consulted and Agree with Plan of Care Patient             Patient will benefit from skilled therapeutic intervention in order to improve the following deficits and impairments:  Abnormal gait, Decreased range of motion, Increased fascial restricitons, Impaired tone, Postural dysfunction, Decreased  strength, Decreased mobility, Impaired flexibility, Hypomobility, Decreased scar mobility, Pain, Decreased activity tolerance, Cardiopulmonary status limiting activity, Decreased endurance  Visit Diagnosis: Chronic bilateral low back pain without sciatica  Other abnormalities of gait and mobility  Pain in thoracic spine  Muscle weakness (generalized)     Problem List Patient Active Problem List   Diagnosis Date Noted   Scoliosis (and kyphoscoliosis), idiopathic 02/06/2012    Markel Kurtenbach, PTA 10/21/2021, 3:15 PM  [facility] @ [place], [place], [address] Phone: [phone]   Fax:  [phone]  Name: [name] MRN: [id number] Date of Birth: Feb 27, 1971

## 2021-10-26 ENCOUNTER — Other Ambulatory Visit: Payer: Self-pay

## 2021-10-26 ENCOUNTER — Ambulatory Visit: Payer: 59

## 2021-10-26 DIAGNOSIS — M545 Low back pain, unspecified: Secondary | ICD-10-CM | POA: Diagnosis not present

## 2021-10-26 DIAGNOSIS — G8929 Other chronic pain: Secondary | ICD-10-CM

## 2021-10-26 DIAGNOSIS — R2689 Other abnormalities of gait and mobility: Secondary | ICD-10-CM

## 2021-10-26 DIAGNOSIS — M6281 Muscle weakness (generalized): Secondary | ICD-10-CM

## 2021-10-26 DIAGNOSIS — M546 Pain in thoracic spine: Secondary | ICD-10-CM

## 2021-10-26 NOTE — Therapy (Signed)
[facility] @ [place], [place], [address] Phone: [phone]   Fax:  [phone]  Physical Therapy Treatment  Patient Details  Name: [name] MRN: [id number] Date of Birth: 12-Jan-1971 Referring Provider (PT): Maurice Small, MD   Encounter Date: 10/26/2021   PT End of Session - 10/26/21 0840     Visit Number 21    Date for PT Re-Evaluation 11/23/21    Authorization Type UHC    Authorization Time Period insurance rolls over 10/20/21    PT Start Time 0801    PT Stop Time 0841    PT Time Calculation (min) 40 min    Activity Tolerance Patient tolerated treatment well;Patient limited by fatigue;Patient limited by pain    Behavior During Therapy [facility] for tasks assessed/performed             History reviewed. No pertinent past medical history.  Past Surgical History:  Procedure Laterality Date   ABDOMINAL HYSTERECTOMY     BREAST LUMPECTOMY Bilateral    HERNIA REPAIR     SPINAL FUSION     TONSILLECTOMY      There were no vitals filed for this visit.   Subjective Assessment - 10/26/21 0804     Subjective I managed during my time off.  I just returned to the pool last week and it went well.  I get really tired when I do anything.    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 Other (Comment)   Just tightness                              [facility] Adult PT Treatment/Exercise - 10/26/21 0001       Lumbar Exercises: Stretches   Active Hamstring Stretch Left;Right;2 reps;20 seconds      Lumbar Exercises:  Aerobic   Nustep L3 x 6' PT presnent to discuss status   shortness of breath at the end of this     Lumbar Exercises: Seated   Sit to Stand 10 reps   2x5     Lumbar Exercises: Supine   Ab Set 10 reps    AB Set Limitations with ball squeeze    Bridge 10 reps    Bridge Limitations 2x5      Shoulder Exercises: Supine   Horizontal ABduction Strengthening;Both;Theraband;20 reps    Theraband Level (Shoulder Horizontal ABduction) Level 1 (Yellow)    External Rotation Strengthening;Both;Theraband;20 reps    Theraband Level (Shoulder External Rotation) Level 1 (Yellow)      Shoulder Exercises: Seated   Other Seated Exercises 3 way raises: 1# x10 each                       PT Short Term Goals - 08/15/21 0815       PT SHORT TERM GOAL #1   Title Pt will learn self massage and assisted massage techniques (husband can help) for improved scar and fascial mobility.    Status Achieved      PT SHORT TERM  GOAL #2   Title Pt will be able to don/doff Lt shoe and sock with at least 50% greater ease due to improved Lt hip mobility.    Status Achieved      PT SHORT TERM GOAL #3   Title Pt will be able to participate in aquatic and land-based appointments with min SOB throughout session    Status Achieved      PT SHORT TERM GOAL #4   Title Reduce 5x sit to stand to </= 17 sec    Baseline 10 sec    Status Achieved               PT Long Term Goals - 09/28/21 0840       PT LONG TERM GOAL #1   Title Pt will be able to return to work for at least half-days most days of the week without exacerbation of fatigue or pain    Baseline working 30-32 hours- 8/10 in the evenings and fatigue that is significant    Time 8    Period Weeks    Status On-going    Target Date 11/23/21      PT LONG TERM GOAL #2   Title Pt will feel strong enough to drive [place] distances (>30 minutes) to improve access to work, appointments and errands.    Baseline short distances now    Time 8     Period Weeks    Status Achieved      PT LONG TERM GOAL #3   Title Pt will participate in 6' walk test covering at least 900' with min SOB and BORG RPE >/= 13 to demo improved endurance.    Status Achieved      PT LONG TERM GOAL #4   Title Pt will be ind with advanced HEP and understand how to safely progress    Time 8    Period Weeks    Status New    Target Date 11/23/21      PT LONG TERM GOAL #5   Title Reduce 5x sit to stand to </= 13 sec to demo improved functional LE strength and endurance    Baseline 8 seconds    Status Achieved      Additional Long Term Goals   Additional Long Term Goals Yes      PT LONG TERM GOAL #6   Title improve LE strength and endurance to perform short community activity up to 30 minutes with independence    Time 8    Period Weeks    Status New    Target Date 11/23/21                   Plan - 10/26/21 0809     Clinical Impression Statement Pt returns PT after having to wait for her insurance to roll over. Pt did remarkably well while in the water last session. Pt continues to experience shortness of breath, significant fatigue and widespread stiffness.  Pt participated in gentle exercise progression and was monitored throughout for symptoms.  Pt will continue to benefit from skilled PT to address chronic fatigue, stiffness and reduced endurance/strength s/p spinal surgery with complications.    PT Frequency 2x / week    PT Duration 8 weeks    PT Treatment/Interventions Aquatic Therapy;ADLs/Self Care Home Management;Electrical Stimulation;Cryotherapy;Moist Heat;Neuromuscular re-education;Balance training;Therapeutic exercise;Therapeutic activities;Functional mobility training;Gait training;Patient/family education;Manual techniques;Dry needling;Scar mobilization;Passive range of motion    PT Next Visit Plan gentle exercise progression, DN to QL and glutes  next session    PT Home Exercise Plan Access Code: [email or web address]    Consulted and Agree  with Plan of Care Patient             Patient will benefit from skilled therapeutic intervention in order to improve the following deficits and impairments:  Abnormal gait, Decreased range of motion, Increased fascial restricitons, Impaired tone, Postural dysfunction, Decreased strength, Decreased mobility, Impaired flexibility, Hypomobility, Decreased scar mobility, Pain, Decreased activity tolerance, Cardiopulmonary status limiting activity, Decreased endurance  Visit Diagnosis: Chronic bilateral low back pain without sciatica  Other abnormalities of gait and mobility  Pain in thoracic spine  Muscle weakness (generalized)     Problem List Patient Active Problem List   Diagnosis Date Noted   Scoliosis (and kyphoscoliosis), idiopathic 02/06/2012   Sigurd Sos, PT 10/26/21 8:43 AM  [facility] @ [place], [place], [address] Phone: [phone]   Fax:  [phone]  Name: Tiffanni Scarfo MRN: [id number] Date of Birth: 1971-07-15

## 2021-11-01 ENCOUNTER — Institutional Professional Consult (permissible substitution): Payer: 59 | Admitting: Pulmonary Disease

## 2021-11-02 ENCOUNTER — Ambulatory Visit: Payer: 59

## 2021-11-02 ENCOUNTER — Other Ambulatory Visit: Payer: Self-pay

## 2021-11-02 ENCOUNTER — Encounter: Payer: Self-pay | Admitting: *Deleted

## 2021-11-02 DIAGNOSIS — R2689 Other abnormalities of gait and mobility: Secondary | ICD-10-CM

## 2021-11-02 DIAGNOSIS — G8929 Other chronic pain: Secondary | ICD-10-CM

## 2021-11-02 DIAGNOSIS — M6281 Muscle weakness (generalized): Secondary | ICD-10-CM

## 2021-11-02 DIAGNOSIS — M545 Low back pain, unspecified: Secondary | ICD-10-CM | POA: Diagnosis not present

## 2021-11-02 DIAGNOSIS — M546 Pain in thoracic spine: Secondary | ICD-10-CM

## 2021-11-02 NOTE — Patient Instructions (Signed)

## 2021-11-02 NOTE — Therapy (Signed)
[facility] @ [place], [place], [address] Phone: [phone]   Fax:  [phone]  Physical Therapy Treatment  Patient Details  Name: [name] MRN: [id number] Date of Birth: 1971/06/05 Referring Provider (PT): Maurice Small, MD   Encounter Date: 11/02/2021   PT End of Session - 11/02/21 0854     Visit Number 22    Date for PT Re-Evaluation 11/23/21    Authorization Type [id number] visit limit through 10/19/22    Authorization - Visit Number 3    Authorization - Number of Visits 23    PT Start Time 0801    PT Stop Time 4008    PT Time Calculation (min) 42 min    Activity Tolerance Patient tolerated treatment well    Behavior During Therapy [facility] for tasks assessed/performed             History reviewed. No pertinent past medical history.  Past Surgical History:  Procedure Laterality Date   ABDOMINAL HYSTERECTOMY     BREAST LUMPECTOMY Bilateral    HERNIA REPAIR     SPINAL FUSION     TONSILLECTOMY      There were no vitals filed for this visit.   Subjective Assessment - 11/02/21 0804     Subjective I stood and talked to a neighbor for 1.5 hours last week.  I haven't stood that long since the surgery.  That put me down for the entire weekend.  I am super tight and want to try DN.    Pertinent History 02/14/21 spinal fusion posterior thoracic T4-L4 performed by Dr Atilano Ina of [facility], working with cardiology and started Beta Blocker to address autonomic dysfunction for tachycardia and PVCs, ongoing Lt shoulder pain, Lt UE resting tremor since surgery    Currently in Pain? No/denies   significant tightness                              [facility] Adult PT Treatment/Exercise - 11/02/21 0001       Lumbar Exercises: Stretches   Other Lumbar Stretch Exercise sidebending quadratus stretch s/p DN x 5 each side      Lumbar Exercises: Aerobic   Nustep L3 x 6' PT presnent to  discuss status   shortness of breath at the end of this     Manual Therapy   Manual Therapy Soft tissue mobilization;Myofascial release    Manual therapy comments skilled palpation and monitoring during DN    Soft tissue mobilization elongation and release to bil quadratus and gluteals              Trigger Point Dry Needling - 11/02/21 0001     Consent Given? Yes    Education Handout Provided Yes    Muscles Treated Back/Hip Gluteus minimus;Gluteus medius;Piriformis;Quadratus lumborum    Gluteus Minimus Response Twitch response elicited;Palpable increased muscle length    Gluteus Medius Response Twitch response elicited;Palpable increased muscle length    Piriformis Response Twitch response elicited;Palpable increased muscle length    Quadratus Lumborum Response Twitch response elicited;Palpable increased muscle length                   PT Education - 11/02/21 0808     Education Details DN info    Person(s) Educated Patient    Methods Explanation;Handout    Comprehension Verbalized understanding  PT Short Term Goals - 08/15/21 0815       PT SHORT TERM GOAL #1   Title Pt will learn self massage and assisted massage techniques (husband can help) for improved scar and fascial mobility.    Status Achieved      PT SHORT TERM GOAL #2   Title Pt will be able to don/doff Lt shoe and sock with at least 50% greater ease due to improved Lt hip mobility.    Status Achieved      PT SHORT TERM GOAL #3   Title Pt will be able to participate in aquatic and land-based appointments with min SOB throughout session    Status Achieved      PT SHORT TERM GOAL #4   Title Reduce 5x sit to stand to </= 17 sec    Baseline 10 sec    Status Achieved               PT Long Term Goals - 09/28/21 0840       PT LONG TERM GOAL #1   Title Pt will be able to return to work for at least half-days most days of the week without exacerbation of fatigue or pain     Baseline working 30-32 hours- 8/10 in the evenings and fatigue that is significant    Time 8    Period Weeks    Status On-going    Target Date 11/23/21      PT LONG TERM GOAL #2   Title Pt will feel strong enough to drive [place] distances (>30 minutes) to improve access to work, appointments and errands.    Baseline short distances now    Time 8    Period Weeks    Status Achieved      PT LONG TERM GOAL #3   Title Pt will participate in 6' walk test covering at least 900' with min SOB and BORG RPE >/= 13 to demo improved endurance.    Status Achieved      PT LONG TERM GOAL #4   Title Pt will be ind with advanced HEP and understand how to safely progress    Time 8    Period Weeks    Status New    Target Date 11/23/21      PT LONG TERM GOAL #5   Title Reduce 5x sit to stand to </= 13 sec to demo improved functional LE strength and endurance    Baseline 8 seconds    Status Achieved      Additional Long Term Goals   Additional Long Term Goals Yes      PT LONG TERM GOAL #6   Title improve LE strength and endurance to perform short community activity up to 30 minutes with independence    Time 8    Period Weeks    Status New    Target Date 11/23/21                   Plan - 11/02/21 0828     Clinical Impression Statement Pt continues to experience shortness of breath, significant fatigue and widespread stiffness.  Session today focused on dry needling and manual therapy to address chronic stiffness in the gluteals and quadratus bilaterally.  Pt with good response to DN with twitch response and improved tissue mobility at the end of the session.   Pt will continue to benefit from skilled PT to address chronic fatigue, stiffness and reduced endurance/strength s/p spinal surgery with  complications.    Examination-Participation Restrictions Driving;Community Activity;Cleaning;Meal Prep;Occupation    Rehab Potential Good    PT Frequency 2x / week    PT Duration 8  weeks    PT Treatment/Interventions Aquatic Therapy;ADLs/Self Care Home Management;Electrical Stimulation;Cryotherapy;Moist Heat;Neuromuscular re-education;Balance training;Therapeutic exercise;Therapeutic activities;Functional mobility training;Gait training;Patient/family education;Manual techniques;Dry needling;Scar mobilization;Passive range of motion    PT Next Visit Plan see how pt responds to DN, aquatics soon,    PT Home Exercise Plan Access Code: [email or web address]    Consulted and Agree with Plan of Care Patient             Patient will benefit from skilled therapeutic intervention in order to improve the following deficits and impairments:  Abnormal gait, Decreased range of motion, Increased fascial restricitons, Impaired tone, Postural dysfunction, Decreased strength, Decreased mobility, Impaired flexibility, Hypomobility, Decreased scar mobility, Pain, Decreased activity tolerance, Cardiopulmonary status limiting activity, Decreased endurance  Visit Diagnosis: Chronic bilateral low back pain without sciatica  Other abnormalities of gait and mobility  Pain in thoracic spine  Muscle weakness (generalized)     Problem List Patient Active Problem List   Diagnosis Date Noted   Scoliosis (and kyphoscoliosis), idiopathic 02/06/2012    Sigurd Sos, PT 11/02/21 8:57 AM   [place] @ [place], [place], [address] Phone: [phone]   Fax:  [phone]  Name: Gustie Bobb MRN: [id number] Date of Birth: 04-07-1971

## 2021-11-03 ENCOUNTER — Ambulatory Visit (INDEPENDENT_AMBULATORY_CARE_PROVIDER_SITE_OTHER): Payer: 59 | Admitting: Neurology

## 2021-11-03 ENCOUNTER — Encounter: Payer: Self-pay | Admitting: Neurology

## 2021-11-03 VITALS — BP 122/85 | HR 73 | Ht 65.5 in | Wt 129.0 lb

## 2021-11-03 DIAGNOSIS — F419 Anxiety disorder, unspecified: Secondary | ICD-10-CM | POA: Diagnosis not present

## 2021-11-03 DIAGNOSIS — M62838 Other muscle spasm: Secondary | ICD-10-CM

## 2021-11-03 DIAGNOSIS — R251 Tremor, unspecified: Secondary | ICD-10-CM

## 2021-11-03 NOTE — Progress Notes (Signed)
Subjective:    Patient ID: Laronica Bhagat is a 50 y.o. female.  HPI    Star Age, MD, PhD [facility] [address], [address] [place], [place] [address]  Dear Dr. Vertell Limber,  I saw your patient, Kirandeep Fariss, upon your kind request in my neurologic clinic today for initial consultation of her hand tremor.  The patient is unaccompanied today.  As you know, Ms. Ardelia Mems is a 50 year old right-handed woman with an underlying medical history of sinus tachycardia, PVCs, hypertension, shortness of breath, back pain, scoliosis, status post breast lumpectomy, hernia repair, spinal fusion, tonsillectomy, who reports tremors affecting all 4 extremities particularly the left upper extremity since approximately March 2022.  She had extensive spine surgery, reports that the surgery was done at [facility] and from level of T4-L4.  She has hardware in place.  She reports that right after her surgery she started having a tremor.  She has not had any sudden onset of one-sided weakness or numbness or tingling or droopy face or slurring of speech, no falls thankfully.  She has had intermittent muscle spasms particularly in the trunk area around the abdomen.  She has been on muscle relaxers.  Her surgeon retired.  Her primary care physician also left the practice and she is currently in the process of looking for another primary care.  I reviewed your office note from 08/24/2021.  She had recent blood work through cardiology on 10/12/2021.  I reviewed blood test results, TSH was normal, CBC normal, BMP normal, magnesium normal.  Per cardiology, she is on midodrine and a beta-blocker.  She has been taking Valium and tizanidine for muscle relaxation.  She has been in physical therapy. She denies a family history of tremors.  She has been anxious, currently not on any anxiety medication, was on Valium anxiety as I understand. She does not drink any alcohol or caffeine on a  day-to-day basis.  Her Past Medical History Is Significant For: Past Medical History:  Diagnosis Date   Autonomic dysfunction    with sinus tachycardia and hypotension   Scoliosis     Her Past Surgical History Is Significant For: Past Surgical History:  Procedure Laterality Date   ABDOMINAL HYSTERECTOMY     BREAST LUMPECTOMY Bilateral    HERNIA REPAIR     SPINAL FUSION     TONSILLECTOMY      Her Family History Is Significant For: Family History  Problem Relation Age of Onset   Autoimmune disease Mother    Scoliosis Mother    Heart disease Father    Diabetes Brother    Suicidality Brother    Heart disease Maternal Grandmother    Heart disease Maternal Grandfather    Breast cancer Paternal Grandmother    Heart disease Paternal Grandfather     Her Social History Is Significant For: Social History   Socioeconomic History   Marital status: Married    Spouse name: Not on file   Number of children: Not on file   Years of education: Not on file   Highest education level: Not on file  Occupational History   Not on file  Tobacco Use   Smoking status: Never   Smokeless tobacco: Never  Vaping Use   Vaping Use: Never used  Substance and Sexual Activity   Alcohol use: No   Drug use: No   Sexual activity: Not on file  Other Topics Concern   Not on file  Social History Narrative  Lives at home with husband   Left handed   Caffeine: none   Social Determinants of Health   Financial Resource Strain: Not on file  Food Insecurity: Not on file  Transportation Needs: Not on file  Physical Activity: Not on file  Stress: Not on file  Social Connections: Not on file    Her Allergies Are:  Allergies  Allergen Reactions   Ciprofloxacin     Other reaction(s): GI Upset (intolerance)   Fluoxetine     Other reaction(s): Other (See Comments) Per patient "don't eat"   Robaxin [Methocarbamol]    Sertraline     Other reaction(s): Other (See Comments) Per patient  "sleeping to much   Venlafaxine     Other reaction(s): Other (See Comments) Per patient "had flu like symptoms"  :   Her Current Medications Are:  Outpatient Encounter Medications as of 11/03/2021  Medication Sig   acebutolol (SECTRAL) 200 MG capsule Take 1 capsule (200 mg total) by mouth 2 (two) times daily.   acetaminophen (TYLENOL) 650 MG CR tablet Take 1,300 mg by mouth every 8 (eight) hours as needed for pain.   ascorbic acid (VITAMIN C) 1000 MG tablet Take 1,000 mg by mouth daily.   B Complex Vitamins (VITAMIN B COMPLEX PO) Take 1 tablet by mouth daily.   CALCIUM PO Take 1,000 mg by mouth daily.   Cholecalciferol (VITAMIN D3 PO) Take 1 tablet by mouth daily.   diazepam (VALIUM) 5 MG tablet Take 5 mg by mouth in the morning, at noon, and at bedtime.   diphenhydrAMINE (SOMINEX) 25 MG tablet Take 25 mg by mouth at bedtime as needed for sleep.   Ibuprofen 200 MG CAPS Take 2 capsules by mouth every 6 (six) hours as needed for pain.   magnesium oxide (MAG-OX) 400 MG tablet Take 400 mg by mouth daily.   midodrine (PROAMATINE) 10 MG tablet Take 1 tablet (10 mg total) by mouth 2 (two) times daily.   Probiotic Product (PROBIOTIC DAILY PO) Take 1 capsule by mouth daily.   [DISCONTINUED] tiZANidine (ZANAFLEX) 2 MG tablet Take 4 mg by mouth 2 (two) times daily. (Patient not taking: Reported on 11/03/2021)   No facility-administered encounter medications on file as of 11/03/2021.  :   Review of Systems:  Out of a complete 14 point review of systems, all are reviewed and negative with the exception of these symptoms as listed below:   Review of Systems  Neurological:        Patient is here for evaluation of her left arm tremor, referral from Dr Vertell Limber. She states she has also had muscle spasms that have been present since her T4-L4 surgery at [facility] on 02/14/21. Muscle spasms are all around her abdomen, back, and right hip. She states no muscle relaxants work. Reports she has had multiple  xrays, echocardiogram and other testing. She states Dr Bettina Gavia (cardiology) thinks she may have nerve problems from the surgery and pt also states she has shortness of breath.   Objective:  Neurological Exam  Physical Exam Physical Examination:   Vitals:   11/03/21 0804  BP: 122/85  Pulse: 73    General Examination: The patient is a very pleasant 50 y.o. female in no acute distress. She appears well-developed and well-nourished and well groomed.   HEENT: Normocephalic, atraumatic, pupils are equal, round and reactive to light, extraocular tracking is good without limitation to gaze excursion or nystagmus noted. Hearing is grossly intact. Face is symmetric with normal facial animation.  Speech is clear with no dysarthria noted. There is no hypophonia. There is no lip, neck/head, jaw or voice tremor. Neck is supple with full range of passive and active motion. There are no carotid bruits on auscultation. Oropharynx exam reveals: mild mouth dryness, tongue protrudes centrally and palate elevates symmetrically.    Chest: Clear to auscultation without wheezing, rhonchi or crackles noted.  Heart: S1+S2+0, regular and normal without murmurs, rubs or gallops noted.   Abdomen: Soft, non-tender and non-distended..  Extremities: There is no obvious edema in the distal lower extremities bilaterally.   Skin: Warm and dry without trophic changes noted.   Musculoskeletal: exam reveals no obvious joint deformities.   Neurologically:  Mental status: The patient is awake, alert and oriented in all 4 spheres. Her immediate and remote memory, attention, language skills and fund of knowledge are appropriate. There is no evidence of aphasia, agnosia, apraxia or anomia. Speech is clear with normal prosody and enunciation. Thought process is linear. Mood is normal and affect is normal.  Cranial nerves II - XII are as described above under HEENT exam.  Motor exam: Normal bulk, strength and tone is noted. There  is no drift, or rebound.  Romberg is negative. Reflexes are 1-2+ throughout.  Toes downgoing bilaterally.   She has had intermittent tremor in her extremities, her tremor is of varying amplitude and frequency.  Some distractibility noted to the tremor.  Left hand tremor reduces with right upper extremity activation and vice versa. Fine motor skills and coordination: grossly intact.  In particular, normal finger taps and hand movements, normal foot taps, no decrement in amplitude. Cerebellar testing: No dysmetria or intention tremor.  Finger-to-nose unremarkable, heel-to-shin without dysmetria.  There is no truncal or gait ataxia.   On Archimedes spiral drawing, no obvious trembling noted with either hand.  Handwriting with the left hand is legible, not tremulous, not micrographic.  Sensory exam: intact to light touch in the upper and lower extremities.  Gait, station and balance: She stands without difficulty.  Posture is without significant scoliosis noted or stooped.  She walks without a walking aid.  Preserved arm swing noted, no shuffling.  Assessment and Plan:  Assessment and Plan:  In summary, Makaelah Cranfield is a very pleasant 50 y.o.-year old female with an underlying medical history of sinus tachycardia, PVCs, hypertension, shortness of breath, back pain, scoliosis, status post breast lumpectomy, hernia repair, spinal fusion, tonsillectomy, who presents for evaluation of her tremors of approximately 9 months duration.  She has intermittent tremors affecting all 4 extremities, mostly in the left upper extremity.  Examination findings are not telltale for essential tremor, history is also not fitting for this.  She has no parkinsonism and is largely reassured.  Tremor could be secondary to stress, she does endorse quite a bit of stress, residual muscle spasms, and a tendency towards anxiety in the past.  She was treated symptomatically for anxiety by her primary care before she left.  She  has yet to find a new primary care provider.  She is advised that I do not have a good explanation for her other symptoms such as her hypotension and shortness of breath.  She is advised to continue follow-up with her cardiologist in that regard.  She is already on a beta-blocker and she is advised that symptomatic treatment of tremors can be with a beta-blocker.  She is furthermore advised to continue to hydrate well with water and avoid caffeine.  Her tremor has a fluctuating frequency  and amplitude.  I suggested we proceed with a brain MRI to rule out a structural cause of her tremor.  As far as her muscle spasms, she is advised to follow-up with you as planned.  She has an appointment for follow-up this month.  In addition, she is advised to check with your office if her hardware is MRI compatible, only then can we proceed with the brain MRI with and without contrast.  I have placed an order for her brain MRI with the comment that she had spine surgery with hardware in place.  We will call her with her brain MRI results, so long as these are benign she will not need follow-up in this clinic.  She is advised to follow-up with you and her other specialists as scheduled and planned.  She is furthermore encouraged to establish care with a new primary care provider.  I answered all their questions today and the patient and her husband were in agreement.   Thank you very much for allowing me to participate in the care of this nice patient. If I can be of any further assistance to you please do not hesitate to call me at [phone].  Sincerely,   Star Age, MD, PhD

## 2021-11-03 NOTE — Patient Instructions (Signed)
It was nice to meet you today.  I am sorry to hear that you have experienced intermittent trembling.  Your tremor affects all 4 extremities but it is on the milder side and is intermittent.  Your history and examination findings do not support a hereditary tremor or Parkinson's-like changes.    For your tremor, I would not recommend any new medication.  Please Recommend a beta-blocker but you are already on a beta-blocker from your cardiologist.  We will do a brain MRI with and without contrast to rule out any structural cause for your tremor.  Unfortunately, I do not have a good explanation for your shortness of breath or low blood pressure.  Please follow-up with cardiologist in that regard.  Please also keep your appointment with Dr. Delice Lesch and ask them if you are able to get an MRI given the hardware in your spine. So long as your MRI shows benign findings, we will call you with the results. We do not have to make a follow up appointment.   Please remember, that any kind of tremor may be exacerbated by anxiety, anger, nervousness, excitement, dehydration, sleep deprivation, by caffeine, and low blood sugar values or blood sugar fluctuations, certain medications such as antidepressant medications, and thyroid dysfunction -to name some common tremor reasons or triggers.   Please continue with physical therapy and your follow-up with your neurosurgeon and cardiologist.  Please establish care with a primary care physician as you have a history of anxiety and may benefit from treatment for anxiety.

## 2021-11-09 ENCOUNTER — Telehealth: Payer: Self-pay | Admitting: Neurology

## 2021-11-09 ENCOUNTER — Ambulatory Visit: Payer: 59 | Admitting: Physical Therapy

## 2021-11-09 ENCOUNTER — Institutional Professional Consult (permissible substitution): Payer: 59 | Admitting: Pulmonary Disease

## 2021-11-09 ENCOUNTER — Other Ambulatory Visit: Payer: Self-pay

## 2021-11-09 ENCOUNTER — Encounter: Payer: Self-pay | Admitting: Physical Therapy

## 2021-11-09 DIAGNOSIS — M546 Pain in thoracic spine: Secondary | ICD-10-CM

## 2021-11-09 DIAGNOSIS — R2689 Other abnormalities of gait and mobility: Secondary | ICD-10-CM

## 2021-11-09 DIAGNOSIS — M545 Low back pain, unspecified: Secondary | ICD-10-CM | POA: Diagnosis not present

## 2021-11-09 DIAGNOSIS — M6281 Muscle weakness (generalized): Secondary | ICD-10-CM

## 2021-11-09 DIAGNOSIS — G8929 Other chronic pain: Secondary | ICD-10-CM

## 2021-11-09 NOTE — Telephone Encounter (Signed)
spoke to the patient she would like to speak to her neuro surgeon & then get back to me [facility] auth: [id number] (exp. 11/09/21 to 12/24/21)

## 2021-11-09 NOTE — Therapy (Signed)
[place] @ [place], [place], [address] Phone: [phone]   Fax:  [phone]  Physical Therapy Treatment  Patient Details  Name: [name] MRN: [id number] Date of Birth: 09-04-1971 Referring Provider (PT): Maurice Small, MD   Encounter Date: 11/09/2021   PT End of Session - 11/09/21 0804     Visit Number 23    Date for PT Re-Evaluation 11/23/21    Authorization Type UHC-23 visit limit through 10/19/22    Authorization - Visit Number 4    Authorization - Number of Visits 23    PT Start Time 0802    PT Stop Time 0846    PT Time Calculation (min) 44 min    Activity Tolerance Patient tolerated treatment well    Behavior During Therapy [facility] for tasks assessed/performed             Past Medical History:  Diagnosis Date   Autonomic dysfunction    with sinus tachycardia and hypotension   Scoliosis     Past Surgical History:  Procedure Laterality Date   ABDOMINAL HYSTERECTOMY     BREAST LUMPECTOMY Bilateral    HERNIA REPAIR     SPINAL FUSION     TONSILLECTOMY      There were no vitals filed for this visit.   Subjective Assessment - 11/09/21 0935     Subjective The dry needling did something good..I felt different, was definitely sore but i think it is a really good sign. I actually felt a little looser.    Pertinent History 02/14/21 spinal fusion posterior thoracic T4-L4 performed by Dr Atilano Ina of [facility], working with cardiology and started Beta Blocker to address autonomic dysfunction for tachycardia and PVCs, ongoing Lt shoulder pain, Lt UE resting tremor since surgery    Currently in Pain? --   I feel the same, no current pain.             Treatment: Patient seen for aquatic therapy today.  Treatment took place in water 2.5-4 feet deep depending upon activity.  Pt entered the pool via steps: slow with mild use of hand rails. Water temp 93 degrees F. Pt requires buoyancy of  water for support and to offload joints with strengthening exercises.    Seated water bench with 75% submersion Pt performed seated LE AROM exercises 20x in all planes, concurrent discussion of current status and latest MD appts.   Standing with 75% submersion: Water walking ( faster pace) with large noodle for UE push/pull 10x forward and back. Added hand paddles for side stepping/squat 10x. High knee marching with yellow noodle for postural support 8x, UE horizontal abd/add with triple buoy 2x10, underewater bicycle 5 min with yellow noodle 2 sets with 2 min rest in between.                             PT Short Term Goals - 08/15/21 0815       PT SHORT TERM GOAL #1   Title Pt will learn self massage and assisted massage techniques (husband can help) for improved scar and fascial mobility.    Status Achieved      PT SHORT TERM GOAL #2   Title Pt will be able to don/doff Lt shoe and sock with at least 50% greater ease due to improved Lt hip mobility.    Status Achieved      PT SHORT TERM GOAL #  3   Title Pt will be able to participate in aquatic and land-based appointments with min SOB throughout session    Status Achieved      PT SHORT TERM GOAL #4   Title Reduce 5x sit to stand to </= 17 sec    Baseline 10 sec    Status Achieved               PT Long Term Goals - 09/28/21 0840       PT LONG TERM GOAL #1   Title Pt will be able to return to work for at least half-days most days of the week without exacerbation of fatigue or pain    Baseline working 30-32 hours- 8/10 in the evenings and fatigue that is significant    Time 8    Period Weeks    Status On-going    Target Date 11/23/21      PT LONG TERM GOAL #2   Title Pt will feel strong enough to drive [place] distances (>30 minutes) to improve access to work, appointments and errands.    Baseline short distances now    Time 8    Period Weeks    Status Achieved      PT LONG TERM GOAL #3    Title Pt will participate in 6' walk test covering at least 900' with min SOB and BORG RPE >/= 13 to demo improved endurance.    Status Achieved      PT LONG TERM GOAL #4   Title Pt will be ind with advanced HEP and understand how to safely progress    Time 8    Period Weeks    Status New    Target Date 11/23/21      PT LONG TERM GOAL #5   Title Reduce 5x sit to stand to </= 13 sec to demo improved functional LE strength and endurance    Baseline 8 seconds    Status Achieved      Additional Long Term Goals   Additional Long Term Goals Yes      PT LONG TERM GOAL #6   Title improve LE strength and endurance to perform short community activity up to 30 minutes with independence    Time 8    Period Weeks    Status New    Target Date 11/23/21                   Plan - 11/09/21 0737     Clinical Impression Statement Pt reports the dry needling last session seemed to do something positive. Pt is excited to try again as she thinks her body responded. Pt could tolerate 5 min of underwater bicycle 2x which is her longest activity in the pool to date. Pt generally moved faster and potentially with greater ease throughout the entire session.    Personal Factors and Comorbidities Comorbidity 1;Time since onset of injury/illness/exacerbation;Transportation;Comorbidity 2    Comorbidities autonomic dysregulation, spinal fusion T4-L4    Examination-Activity Limitations Locomotion Level;Bed Mobility;Bend;Sit;Stand;Dressing    Examination-Participation Restrictions Driving;Community Activity;Cleaning;Meal Prep;Occupation    Stability/Clinical Decision Making Evolving/Moderate complexity    Rehab Potential Good    PT Frequency 2x / week    PT Duration 8 weeks    PT Treatment/Interventions Aquatic Therapy;ADLs/Self Care Home Management;Electrical Stimulation;Cryotherapy;Moist Heat;Neuromuscular re-education;Balance training;Therapeutic exercise;Therapeutic activities;Functional mobility  training;Gait training;Patient/family education;Manual techniques;Dry needling;Scar mobilization;Passive range of motion    PT Next Visit Plan DN and aquatics    PT Home Exercise  Plan Access Code: [email or web address]             Patient will benefit from skilled therapeutic intervention in order to improve the following deficits and impairments:  Abnormal gait, Decreased range of motion, Increased fascial restricitons, Impaired tone, Postural dysfunction, Decreased strength, Decreased mobility, Impaired flexibility, Hypomobility, Decreased scar mobility, Pain, Decreased activity tolerance, Cardiopulmonary status limiting activity, Decreased endurance  Visit Diagnosis: Chronic bilateral low back pain without sciatica  Other abnormalities of gait and mobility  Pain in thoracic spine  Muscle weakness (generalized)     Problem List Patient Active Problem List   Diagnosis Date Noted   Scoliosis (and kyphoscoliosis), idiopathic 02/06/2012    Edynn Gillock, PTA 11/09/2021, 10:51 AM  [place] @ [place], [place], [address] Phone: [phone]   Fax:  [phone]  Name: Kimberl Vig MRN: [id number] Date of Birth: 19-Oct-1971

## 2021-11-15 ENCOUNTER — Other Ambulatory Visit: Payer: Self-pay

## 2021-11-15 ENCOUNTER — Ambulatory Visit: Payer: 59

## 2021-11-15 DIAGNOSIS — M546 Pain in thoracic spine: Secondary | ICD-10-CM

## 2021-11-15 DIAGNOSIS — R2689 Other abnormalities of gait and mobility: Secondary | ICD-10-CM

## 2021-11-15 DIAGNOSIS — M545 Low back pain, unspecified: Secondary | ICD-10-CM | POA: Diagnosis not present

## 2021-11-15 DIAGNOSIS — M6281 Muscle weakness (generalized): Secondary | ICD-10-CM

## 2021-11-15 DIAGNOSIS — G8929 Other chronic pain: Secondary | ICD-10-CM

## 2021-11-15 NOTE — Therapy (Signed)
[place] @ [place], [place], [address] Phone: [phone]   Fax:  [phone]  Physical Therapy Treatment  Patient Details  Name: [name] MRN: [id number] Date of Birth: [birth year] Referring Provider (PT): Maurice Small, MD   Encounter Date: 11/15/2021   PT End of Session - 11/15/21 0850     Visit Number 24    Date for PT Re-Evaluation 11/23/21    Authorization Type UHC-23 visit limit through 10/19/22    Authorization - Visit Number 5    Authorization - Number of Visits 23    PT Start Time 0803    PT Stop Time 0848    PT Time Calculation (min) 45 min    Activity Tolerance Patient tolerated treatment well    Behavior During Therapy [facility] for tasks assessed/performed             Past Medical History:  Diagnosis Date   Autonomic dysfunction    with sinus tachycardia and hypotension   Scoliosis     Past Surgical History:  Procedure Laterality Date   ABDOMINAL HYSTERECTOMY     BREAST LUMPECTOMY Bilateral    HERNIA REPAIR     SPINAL FUSION     TONSILLECTOMY      There were no vitals filed for this visit.   Subjective Assessment - 11/15/21 0808     Subjective The needling helped to activate things in my back and hips.    Currently in Pain? No/denies                               [facility] Adult PT Treatment/Exercise - 11/15/21 0001       Lumbar Exercises: Stretches   Active Hamstring Stretch Left;Right;2 reps;20 seconds      Lumbar Exercises: Aerobic   Nustep L3 x 6' PT presnent to discuss status   shortness of breath at the end of this     Lumbar Exercises: Seated   Sit to Stand 10 reps   2x5     Lumbar Exercises: Supine   Ab Set --    AB Set Limitations --      Shoulder Exercises: Supine   Horizontal ABduction --    Theraband Level (Shoulder Horizontal ABduction) --    External Rotation Strengthening;Both;Theraband;20 reps    Theraband Level  (Shoulder External Rotation) Level 1 (Yellow)      Shoulder Exercises: Seated   Other Seated Exercises 3 way raises: 1# x10 each      Manual Therapy   Manual Therapy Soft tissue mobilization;Myofascial release    Manual therapy comments skilled palpation and monitoring during DN    Soft tissue mobilization elongation and release to bil quadratus and gluteals              Trigger Point Dry Needling - 11/15/21 0001     Consent Given? Yes    Education Handout Provided Previously provided    Muscles Treated Back/Hip Gluteus minimus;Gluteus medius;Piriformis;Quadratus lumborum    Gluteus Minimus Response Twitch response elicited;Palpable increased muscle length    Gluteus Medius Response Twitch response elicited;Palpable increased muscle length    Piriformis Response Twitch response elicited;Palpable increased muscle length    Quadratus Lumborum Response Twitch response elicited;Palpable increased muscle length                     PT Short Term Goals - 08/15/21 6962  PT SHORT TERM GOAL #1   Title Pt will learn self massage and assisted massage techniques (husband can help) for improved scar and fascial mobility.    Status Achieved      PT SHORT TERM GOAL #2   Title Pt will be able to don/doff Lt shoe and sock with at least 50% greater ease due to improved Lt hip mobility.    Status Achieved      PT SHORT TERM GOAL #3   Title Pt will be able to participate in aquatic and land-based appointments with min SOB throughout session    Status Achieved      PT SHORT TERM GOAL #4   Title Reduce 5x sit to stand to </= 17 sec    Baseline 10 sec    Status Achieved               PT Long Term Goals - 09/28/21 0840       PT LONG TERM GOAL #1   Title Pt will be able to return to work for at least half-days most days of the week without exacerbation of fatigue or pain    Baseline working 30-32 hours- 8/10 in the evenings and fatigue that is significant    Time 8     Period Weeks    Status On-going    Target Date 11/23/21      PT LONG TERM GOAL #2   Title Pt will feel strong enough to drive [place] distances (>30 minutes) to improve access to work, appointments and errands.    Baseline short distances now    Time 8    Period Weeks    Status Achieved      PT LONG TERM GOAL #3   Title Pt will participate in 6' walk test covering at least 900' with min SOB and BORG RPE >/= 13 to demo improved endurance.    Status Achieved      PT LONG TERM GOAL #4   Title Pt will be ind with advanced HEP and understand how to safely progress    Time 8    Period Weeks    Status New    Target Date 11/23/21      PT LONG TERM GOAL #5   Title Reduce 5x sit to stand to </= 13 sec to demo improved functional LE strength and endurance    Baseline 8 seconds    Status Achieved      Additional Long Term Goals   Additional Long Term Goals Yes      PT LONG TERM GOAL #6   Title improve LE strength and endurance to perform short community activity up to 30 minutes with independence    Time 8    Period Weeks    Status New    Target Date 11/23/21                   Plan - 11/15/21 0852     Clinical Impression Statement Pt continues to experience shortness of breath, significant fatigue and widespread stiffness.  Pt continues to exercise as able at home and participate in aquatic PT.  Pt reports good response to DN with improved tissue mobility, improved senstation and reduced stiffness overall after last session.  Session today focused on dry needling and manual therapy to address chronic stiffness in the gluteals and quadratus bilaterally.  Pt with good response to DN with twitch response and improved tissue mobility at the end of the session.  Pt with hypersensitivity on the Lt sided. Pt will continue to benefit from skilled PT to address chronic fatigue, stiffness and reduced endurance/strength s/p spinal surgery with complications.    PT Frequency 2x  / week    PT Duration 8 weeks    PT Treatment/Interventions Aquatic Therapy;ADLs/Self Care Home Management;Electrical Stimulation;Cryotherapy;Moist Heat;Neuromuscular re-education;Balance training;Therapeutic exercise;Therapeutic activities;Functional mobility training;Gait training;Patient/family education;Manual techniques;Dry needling;Scar mobilization;Passive range of motion    PT Next Visit Plan DN and aquatics    PT Home Exercise Plan Access Code: [email or web address]    Consulted and Agree with Plan of Care Patient             Patient will benefit from skilled therapeutic intervention in order to improve the following deficits and impairments:  Abnormal gait, Decreased range of motion, Increased fascial restricitons, Impaired tone, Postural dysfunction, Decreased strength, Decreased mobility, Impaired flexibility, Hypomobility, Decreased scar mobility, Pain, Decreased activity tolerance, Cardiopulmonary status limiting activity, Decreased endurance  Visit Diagnosis: Chronic bilateral low back pain without sciatica  Other abnormalities of gait and mobility  Pain in thoracic spine  Muscle weakness (generalized)     Problem List Patient Active Problem List   Diagnosis Date Noted   Scoliosis (and kyphoscoliosis), idiopathic 02/06/2012   Sigurd Sos, PT 11/15/21 8:53 AM   [place] @ [place], [place], [address] Phone: [phone]   Fax:  [phone]  Name: [name] MRN: [id number] Date of Birth: [birth year]

## 2021-11-18 ENCOUNTER — Encounter: Payer: Self-pay | Admitting: Physical Therapy

## 2021-11-18 ENCOUNTER — Other Ambulatory Visit: Payer: Self-pay

## 2021-11-18 ENCOUNTER — Ambulatory Visit: Payer: 59 | Admitting: Physical Therapy

## 2021-11-18 DIAGNOSIS — R2689 Other abnormalities of gait and mobility: Secondary | ICD-10-CM

## 2021-11-18 DIAGNOSIS — M545 Low back pain, unspecified: Secondary | ICD-10-CM

## 2021-11-18 DIAGNOSIS — M546 Pain in thoracic spine: Secondary | ICD-10-CM

## 2021-11-18 DIAGNOSIS — M6281 Muscle weakness (generalized): Secondary | ICD-10-CM

## 2021-11-18 NOTE — Therapy (Signed)
[place] @ [place] [place], [place], [address] Phone: [phone]   Fax:  [phone]  Physical Therapy Treatment  Patient Details  Name: [name] MRN: [id number] Date of Birth: October 24, 1971 Referring Provider (PT): Maurice Small, MD   Encounter Date: 11/18/2021   PT End of Session - 11/18/21 1438     Visit Number 25    Date for PT Re-Evaluation 11/23/21    Authorization Type UHC-23 visit limit through 10/19/22    Authorization - Visit Number 6    Authorization - Number of Visits 23    PT Start Time 1300    PT Stop Time 5170    PT Time Calculation (min) 55 min    Activity Tolerance Patient tolerated treatment well             Past Medical History:  Diagnosis Date   Autonomic dysfunction    with sinus tachycardia and hypotension   Scoliosis     Past Surgical History:  Procedure Laterality Date   ABDOMINAL HYSTERECTOMY     BREAST LUMPECTOMY Bilateral    HERNIA REPAIR     SPINAL FUSION     TONSILLECTOMY      There were no vitals filed for this visit.   Subjective Assessment - 11/18/21 1437     Subjective Super sore after last PT session but I think it is waking things up.    Patient is accompained by: Family member    Currently in Pain? Yes   Upper abdominals, LT shoulder, LT flank all sore; 6-7/10             Treatment: Patient seen for aquatic therapy today.  Treatment took place in water 2.5-4 feet deep depending upon activity.  Pt entered the pool via stairs, mild use of hand rails. Pt requires buoyancy of water for support and to offload joints with strengthening exercises.  Water temp 95 degrees F.   Seated water bench with 75% submersion Pt performed seated LE AROM exercises 20x in all planes, concurrent discussion of current status.  75% submersion: Water walking with mitten hands as UE flex/ext in water 6x each direction. Hand paddles added for side stepping 6x. Hip  flex/ext Bil, circumduction all 10x2 of all hip exs.  Seated decompression with yellow noodle, Vc to let LE sway to the RT/LT for lateral hip stretching f/b underwater bicycle 5 min 3 bouts. 1 min rest ( with stretching) in between.  Breast stroke 6x short end of pool with yellow noodle under pts chest.                             PT Short Term Goals - 08/15/21 0815       PT SHORT TERM GOAL #1   Title Pt will learn self massage and assisted massage techniques (husband can help) for improved scar and fascial mobility.    Status Achieved      PT SHORT TERM GOAL #2   Title Pt will be able to don/doff Lt shoe and sock with at least 50% greater ease due to improved Lt hip mobility.    Status Achieved      PT SHORT TERM GOAL #3   Title Pt will be able to participate in aquatic and land-based appointments with min SOB throughout session    Status Achieved      PT SHORT TERM GOAL #4   Title Reduce 5x  sit to stand to </= 17 sec    Baseline 10 sec    Status Achieved               PT Long Term Goals - 09/28/21 0840       PT LONG TERM GOAL #1   Title Pt will be able to return to work for at least half-days most days of the week without exacerbation of fatigue or pain    Baseline working 30-32 hours- 8/10 in the evenings and fatigue that is significant    Time 8    Period Weeks    Status On-going    Target Date 11/23/21      PT LONG TERM GOAL #2   Title Pt will feel strong enough to drive [place] distances (>30 minutes) to improve access to work, appointments and errands.    Baseline short distances now    Time 8    Period Weeks    Status Achieved      PT LONG TERM GOAL #3   Title Pt will participate in 6' walk test covering at least 900' with min SOB and BORG RPE >/= 13 to demo improved endurance.    Status Achieved      PT LONG TERM GOAL #4   Title Pt will be ind with advanced HEP and understand how to safely progress    Time 8    Period  Weeks    Status New    Target Date 11/23/21      PT LONG TERM GOAL #5   Title Reduce 5x sit to stand to </= 13 sec to demo improved functional LE strength and endurance    Baseline 8 seconds    Status Achieved      Additional Long Term Goals   Additional Long Term Goals Yes      PT LONG TERM GOAL #6   Title improve LE strength and endurance to perform short community activity up to 30 minutes with independence    Time 8    Period Weeks    Status New    Target Date 11/23/21                   Plan - 11/18/21 1440     Clinical Impression Statement Pt swam assisted with yellow noodle 6 lengths of teh pool ( short side) today and perormed underwater bicycle for 5 min 3x with minimla shortness of breath.    Personal Factors and Comorbidities Comorbidity 1;Time since onset of injury/illness/exacerbation;Transportation;Comorbidity 2    Comorbidities autonomic dysregulation, spinal fusion T4-L4    Examination-Activity Limitations Locomotion Level;Bed Mobility;Bend;Sit;Stand;Dressing    Stability/Clinical Decision Making Evolving/Moderate complexity    Rehab Potential Good    PT Frequency 2x / week    PT Duration 8 weeks    PT Treatment/Interventions Aquatic Therapy;ADLs/Self Care Home Management;Electrical Stimulation;Cryotherapy;Moist Heat;Neuromuscular re-education;Balance training;Therapeutic exercise;Therapeutic activities;Functional mobility training;Gait training;Patient/family education;Manual techniques;Dry needling;Scar mobilization;Passive range of motion    PT Next Visit Plan DN and aquatics    PT Home Exercise Plan Access Code: [email or web address]    Consulted and Agree with Plan of Care Patient             Patient will benefit from skilled therapeutic intervention in order to improve the following deficits and impairments:  Abnormal gait, Decreased range of motion, Increased fascial restricitons, Impaired tone, Postural dysfunction, Decreased strength, Decreased mobility,  Impaired flexibility, Hypomobility, Decreased scar mobility, Pain, Decreased activity tolerance, Cardiopulmonary status limiting activity, Decreased  endurance  Visit Diagnosis: Chronic bilateral low back pain without sciatica  Other abnormalities of gait and mobility  Pain in thoracic spine  Muscle weakness (generalized)     Problem List Patient Active Problem List   Diagnosis Date Noted   Scoliosis (and kyphoscoliosis), idiopathic 02/06/2012    Dianelys Scinto, PTA 11/18/2021, 3:54 PM  [facility] @ [place], [place], [address] Phone: [phone]   Fax:  [phone]  Name: [name] MRN: [id number] Date of Birth: 02-16-1971

## 2021-11-22 ENCOUNTER — Ambulatory Visit: Payer: 59 | Attending: Family Medicine

## 2021-11-22 ENCOUNTER — Other Ambulatory Visit: Payer: Self-pay

## 2021-11-22 DIAGNOSIS — G8929 Other chronic pain: Secondary | ICD-10-CM | POA: Diagnosis present

## 2021-11-22 DIAGNOSIS — M6281 Muscle weakness (generalized): Secondary | ICD-10-CM | POA: Insufficient documentation

## 2021-11-22 DIAGNOSIS — M545 Low back pain, unspecified: Secondary | ICD-10-CM | POA: Diagnosis present

## 2021-11-22 DIAGNOSIS — M546 Pain in thoracic spine: Secondary | ICD-10-CM | POA: Diagnosis present

## 2021-11-22 DIAGNOSIS — R2689 Other abnormalities of gait and mobility: Secondary | ICD-10-CM | POA: Diagnosis present

## 2021-11-22 NOTE — Therapy (Signed)
[place] @ [place], [place], [address] Phone: [phone]   Fax:  [phone]  Physical Therapy Treatment  Patient Details  Name: [name] MRN: [id number] Date of Birth: [birth year] Referring Provider (PT): Sela Hilding, MD (Dr Justin Mend no longer with the practice).   Encounter Date: 11/22/2021   PT End of Session - 11/22/21 0943     Visit Number 26    Date for PT Re-Evaluation 02/14/22    Authorization Type UHC-23 visit limit through 10/19/22    Authorization - Visit Number 7    Authorization - Number of Visits 23    PT Start Time 0804    PT Stop Time 0843    PT Time Calculation (min) 39 min    Activity Tolerance Patient tolerated treatment well    Behavior During Therapy WFL for tasks assessed/performed             Past Medical History:  Diagnosis Date   Autonomic dysfunction    with sinus tachycardia and hypotension   Scoliosis     Past Surgical History:  Procedure Laterality Date   ABDOMINAL HYSTERECTOMY     BREAST LUMPECTOMY Bilateral    HERNIA REPAIR     SPINAL FUSION     TONSILLECTOMY      There were no vitals filed for this visit.   Subjective Assessment - 11/22/21 0804     Subjective I am very stiff.  I have been walking as much as I can.  I feel 20% improvement in overall stiffness since starting DN.  30% overall improved endurnace- slow progress.    Pertinent History 02/14/21 spinal fusion posterior thoracic T4-L4 performed by Dr Atilano Ina of [facility], working with cardiology and started Beta Blocker to address autonomic dysfunction for tachycardia and PVCs, ongoing Lt shoulder pain, Lt UE resting tremor since surgery    Currently in Pain? Yes    Pain Score --   significant stiffness   Pain Location Back    Pain Orientation Right;Left;Lower    Pain Descriptors / Indicators Tightness;Sore    Pain Type Surgical pain;Chronic pain    Pain Onset More than a month  ago    Pain Frequency Constant    Aggravating Factors  constant, movement    Pain Relieving Factors medication                [facility] PT Assessment - 11/22/21 0001       Assessment   Medical Diagnosis R29.898 (ICD-10-CM) - Other symptoms and signs involving the musculoskeletal system    Referring Provider (PT) Sela Hilding, MD (Dr Justin Mend no longer with the practice).      Precautions   Precaution Comments spinal fusion T4-L4, Dr Vertell Limber says no restrictions at this point      [place] Spouse/significant other    Home Layout Two level      Prior Function   Level of Independence Independent with basic ADLs;Other (comment);Independent with transfers    Vocation Full time employment    Vocation Requirements Pt is working 30-32 hours a week      Strength   Overall Strength Comments bil LEs 4+/5, bil shoulders 4+/5 except ER is 4/5      Palpation   Spinal mobility fusion T4-L4  [place] Adult PT Treatment/Exercise - 11/22/21 0001       Lumbar Exercises: Aerobic   Nustep L3 x 6' PT presnent to discuss status   shortness of breath at the end of this     Manual Therapy   Manual Therapy Soft tissue mobilization;Myofascial release    Manual therapy comments skilled palpation and monitoring during DN    Soft tissue mobilization elongation and release to bil quadratus and gluteals, Lt scapular mobs and elongation to rhomboids              Trigger Point Dry Needling - 11/22/21 0001     Consent Given? Yes    Education Handout Provided Previously provided    Muscles Treated Upper Quadrant Subscapularis;Rhomboids   Lt only   Muscles Treated Back/Hip Gluteus minimus;Gluteus medius;Piriformis;Quadratus lumborum    Rhomboids Response Twitch response elicited;Palpable increased muscle length    Subscapularis Response Twitch response elicited;Palpable increased muscle  length    Gluteus Minimus Response Twitch response elicited;Palpable increased muscle length    Gluteus Medius Response Twitch response elicited;Palpable increased muscle length    Piriformis Response Twitch response elicited;Palpable increased muscle length    Quadratus Lumborum Response Twitch response elicited;Palpable increased muscle length                     PT Short Term Goals - 08/15/21 0815       PT SHORT TERM GOAL #1   Title Pt will learn self massage and assisted massage techniques (husband can help) for improved scar and fascial mobility.    Status Achieved      PT SHORT TERM GOAL #2   Title Pt will be able to don/doff Lt shoe and sock with at least 50% greater ease due to improved Lt hip mobility.    Status Achieved      PT SHORT TERM GOAL #3   Title Pt will be able to participate in aquatic and land-based appointments with min SOB throughout session    Status Achieved      PT SHORT TERM GOAL #4   Title Reduce 5x sit to stand to </= 17 sec    Baseline 10 sec    Status Achieved               PT Long Term Goals - 11/22/21 0813       PT LONG TERM GOAL #1   Title Pt will be able to return to work for at least half-days most days of the week and report 50% improved endurance for this    Baseline working 30-32, fatigue still present, 30% better.    Time 12    Period Weeks    Status Revised    Target Date 02/14/22      PT LONG TERM GOAL #2   Title Pt will feel strong enough to drive [place] distances (>30 minutes) to improve access to work, appointments and errands.    Status Achieved      PT LONG TERM GOAL #3   Title Pt will participate in 6' walk test covering at least 900' with min SOB and BORG RPE >/= 13 to demo improved endurance.    Status Achieved      PT LONG TERM GOAL #4   Title Pt will be ind with advanced HEP and understand how to safely progress    Time 12    Period Weeks    Status On-going    Target Date 02/14/22  PT LONG TERM GOAL #5   Title report > or = to 50-60% overall improved endurance for daily tasks    Baseline 30%    Time 12    Period Weeks    Status Not Met    Target Date 02/14/22      PT LONG TERM GOAL #6   Title report 50-60% improved feeling of stiffness with daily tasks to improve comfort    Time 12    Period Weeks    Status New    Target Date 02/14/22                   Plan - 11/22/21 0941     Clinical Impression Statement Pt continues to experience shortness of breath, significant fatigue and widespread stiffness.  Pt continues to exercise as able at home and participate in aquatic PT. Pt reports 20% improved mobility since starting DN and 30% improved endurance overall since the start of care.  Pt is now able to walk 1 hour and reports moderate fatigue after.   Pt reports good response to DN with improved tissue mobility, improved sensation and reduced stiffness overall after last session.  Session today focused on dry needling and manual therapy to address chronic stiffness in the gluteals bilaterally, Rt quadratus, and Lt scapular musculature. Pt with good response to DN with twitch response and improved tissue mobility at the end of the session.  Pt with significant Lt scapular hypomobility.    Pt with hypersensitivity on the Lt side. Pt will continue to benefit from skilled PT to address chronic fatigue, stiffness and reduced endurance/strength s/p spinal surgery with complications.    PT Frequency 2x / week    PT Duration 12 weeks    PT Treatment/Interventions Aquatic Therapy;ADLs/Self Care Home Management;Electrical Stimulation;Cryotherapy;Moist Heat;Neuromuscular re-education;Balance training;Therapeutic exercise;Therapeutic activities;Functional mobility training;Gait training;Patient/family education;Manual techniques;Dry needling;Scar mobilization;Passive range of motion;Taping;Energy conservation    PT Next Visit Plan Pt will continue through the end of January with  land and aquatics with probable transition to [employer].  Might consider additional DN if needed intermittently.    PT Home Exercise Plan Access Code: [email or web address]    Recommended Other Services recert sent 0/1/65    Consulted and Agree with Plan of Care Patient             Patient will benefit from skilled therapeutic intervention in order to improve the following deficits and impairments:  Abnormal gait, Decreased range of motion, Increased fascial restricitons, Impaired tone, Postural dysfunction, Decreased strength, Decreased mobility, Impaired flexibility, Hypomobility, Decreased scar mobility, Pain, Decreased activity tolerance, Cardiopulmonary status limiting activity, Decreased endurance, Increased muscle spasms  Visit Diagnosis: Chronic bilateral low back pain without sciatica - Plan: PT plan of care cert/re-cert  Other abnormalities of gait and mobility - Plan: PT plan of care cert/re-cert  Pain in thoracic spine - Plan: PT plan of care cert/re-cert  Muscle weakness (generalized) - Plan: PT plan of care cert/re-cert     Problem List Patient Active Problem List   Diagnosis Date Noted   Scoliosis (and kyphoscoliosis), idiopathic 02/06/2012   Sigurd Sos, PT 11/22/21 9:46 AM   [place] @ [place], [place], [address] Phone: [phone]   Fax:  [phone]  Name: Sophia Sperry MRN: [id number] Date of Birth: 09-Jun-1971

## 2021-11-25 ENCOUNTER — Encounter: Payer: Self-pay | Admitting: Physical Therapy

## 2021-11-25 ENCOUNTER — Other Ambulatory Visit: Payer: Self-pay

## 2021-11-25 ENCOUNTER — Ambulatory Visit: Payer: 59 | Admitting: Physical Therapy

## 2021-11-25 DIAGNOSIS — M546 Pain in thoracic spine: Secondary | ICD-10-CM

## 2021-11-25 DIAGNOSIS — G8929 Other chronic pain: Secondary | ICD-10-CM

## 2021-11-25 DIAGNOSIS — R2689 Other abnormalities of gait and mobility: Secondary | ICD-10-CM

## 2021-11-25 DIAGNOSIS — M545 Low back pain, unspecified: Secondary | ICD-10-CM

## 2021-11-25 DIAGNOSIS — M6281 Muscle weakness (generalized): Secondary | ICD-10-CM

## 2021-11-25 NOTE — Therapy (Signed)
[place] @ [place], [place], [address] Phone: [phone]   Fax:  [phone]  Physical Therapy Treatment  Patient Details  Name: [name] MRN: [id number] Date of Birth: [birth year] Referring Provider (PT): Sela Hilding, MD (Dr Justin Mend no longer with the practice).   Encounter Date: 11/25/2021   PT End of Session - 11/25/21 1515     Visit Number 27    Date for PT Re-Evaluation 02/14/22    Authorization Type UHC-23 visit limit through 10/19/22    Authorization Time Period insurance rolls over 10/20/21    Authorization - Visit Number 8    Authorization - Number of Visits 23    PT Start Time 0100    PT Stop Time 1430    PT Time Calculation (min) 45 min    Activity Tolerance Patient tolerated treatment well    Behavior During Therapy WFL for tasks assessed/performed             Past Medical History:  Diagnosis Date   Autonomic dysfunction    with sinus tachycardia and hypotension   Scoliosis     Past Surgical History:  Procedure Laterality Date   ABDOMINAL HYSTERECTOMY     BREAST LUMPECTOMY Bilateral    HERNIA REPAIR     SPINAL FUSION     TONSILLECTOMY      There were no vitals filed for this visit.   Subjective Assessment - 11/25/21 1612     Subjective Looser from last dry needling. Shoulder hurt but she feels beneficial to get it moving.    Pertinent History 02/14/21 spinal fusion posterior thoracic T4-L4 performed by Dr Atilano Ina of [facility], working with cardiology and started Beta Blocker to address autonomic dysfunction for tachycardia and PVCs, ongoing Lt shoulder pain, Lt UE resting tremor since surgery    Currently in Pain? Yes    Pain Location Abdomen    Pain Descriptors / Indicators Tightness;Sore             Treatment: Patient seen for aquatic therapy today.  Treatment took place in water 2.5-4 feet deep depending upon activity.  Pt entered the pool via  stairs with mild use of the rails. Water temp 92 degrees F. Pt requires buoyancy of water for support and to offload joints with strengthening exercises.  Pt utilizes viscosity of the water required for strengthening.   Seated water bench with 75% submersion Pt performed seated LE AROM exercises 20x in all planes, concurrent discuss of status.  Standing: 75% submersion. Water walking with running arms and increased speed 10x forward & back, 10x side stepping with hand paddles and mini squat. High knee marching with yellow noodle 8x. Hip hinge/flex/ext 10x Bil. Breast stroke with blue noodle supporting chest 8x short length of pool. Underwater bicycle 5 min 2 bouts.1 min between each. UE water weights with increased submersion 2x10.                                  PT Long Term Goals - 11/22/21 0813       PT LONG TERM GOAL #1   Title Pt will be able to return to work for at least half-days most days of the week and report 50% improved endurance for this    Baseline working 30-32, fatigue still present, 30% better.    Time 12    Period Weeks    Status  Revised    Target Date 02/14/22      PT LONG TERM GOAL #2   Title Pt will feel strong enough to drive [place] distances (>30 minutes) to improve access to work, appointments and errands.    Status Achieved      PT LONG TERM GOAL #3   Title Pt will participate in 6' walk test covering at least 900' with min SOB and BORG RPE >/= 13 to demo improved endurance.    Status Achieved      PT LONG TERM GOAL #4   Title Pt will be ind with advanced HEP and understand how to safely progress    Time 12    Period Weeks    Status On-going    Target Date 02/14/22      PT LONG TERM GOAL #5   Title report > or = to 50-60% overall improved endurance for daily tasks    Baseline 30%    Time 12    Period Weeks    Status Not Met    Target Date 02/14/22      PT LONG TERM GOAL #6   Title report 50-60% improved feeling  of stiffness with daily tasks to improve comfort    Time 12    Period Weeks    Status New    Target Date 02/14/22                   Plan - 11/25/21 1517     Clinical Impression Statement Pt reports improved breathing when in the pool. Pt performed much faster speeds with her underwater bicycle today. Pt reports all motions feel ok to do but just difficult to do. Pt feels benefit from dry needling. Pt was able to cook dinner 1x this week for the first time in many months.    Personal Factors and Comorbidities Comorbidity 1;Time since onset of injury/illness/exacerbation;Transportation;Comorbidity 2    Comorbidities autonomic dysregulation, spinal fusion T4-L4    Examination-Activity Limitations Locomotion Level;Bed Mobility;Bend;Sit;Stand;Dressing    Examination-Participation Restrictions Driving;Community Activity;Cleaning;Meal Prep;Occupation    Stability/Clinical Decision Making Evolving/Moderate complexity    Rehab Potential Good    PT Frequency 2x / week    PT Duration 12 weeks    PT Treatment/Interventions Aquatic Therapy;ADLs/Self Care Home Management;Electrical Stimulation;Cryotherapy;Moist Heat;Neuromuscular re-education;Balance training;Therapeutic exercise;Therapeutic activities;Functional mobility training;Gait training;Patient/family education;Manual techniques;Dry needling;Scar mobilization;Passive range of motion;Taping;Energy conservation    PT Next Visit Plan Pt will continue through the end of January with land and aquatics with probable transition to [employer].  Might consider additional DN if needed intermittently.    PT Home Exercise Plan Access Code: [email or web address]    Consulted and Agree with Plan of Care Patient             Patient will benefit from skilled therapeutic intervention in order to improve the following deficits and impairments:  Abnormal gait, Decreased range of motion, Increased fascial restricitons, Impaired tone, Postural dysfunction, Decreased  strength, Decreased mobility, Impaired flexibility, Hypomobility, Decreased scar mobility, Pain, Decreased activity tolerance, Cardiopulmonary status limiting activity, Decreased endurance, Increased muscle spasms  Visit Diagnosis: Chronic bilateral low back pain without sciatica  Other abnormalities of gait and mobility  Pain in thoracic spine  Muscle weakness (generalized)     Problem List Patient Active Problem List   Diagnosis Date Noted   Scoliosis (and kyphoscoliosis), idiopathic 02/06/2012    COCHRAN,JENNIFER, PTA 11/25/2021, 4:19 PM  [place] @ [place], [place],  [address] Phone: [phone]   Fax:  [phone]  Name: [name] MRN: [id number] Date of Birth: [birth year]

## 2021-11-29 ENCOUNTER — Other Ambulatory Visit: Payer: Self-pay

## 2021-11-29 ENCOUNTER — Ambulatory Visit: Payer: 59

## 2021-11-29 DIAGNOSIS — R2689 Other abnormalities of gait and mobility: Secondary | ICD-10-CM

## 2021-11-29 DIAGNOSIS — M6281 Muscle weakness (generalized): Secondary | ICD-10-CM

## 2021-11-29 DIAGNOSIS — G8929 Other chronic pain: Secondary | ICD-10-CM

## 2021-11-29 DIAGNOSIS — M546 Pain in thoracic spine: Secondary | ICD-10-CM

## 2021-11-29 DIAGNOSIS — M545 Low back pain, unspecified: Secondary | ICD-10-CM | POA: Diagnosis not present

## 2021-11-29 NOTE — Therapy (Signed)
[place] @ [place], [place], [address] Phone: [phone]   Fax:  [phone]  Physical Therapy Treatment  Patient Details  Name: [name] MRN: [id number] Date of Birth: [birth year] Referring Provider (PT): Sela Hilding, MD (Dr Justin Mend no longer with the practice).   Encounter Date: 11/29/2021   PT End of Session - 11/29/21 0846     Visit Number 28    Date for PT Re-Evaluation 02/14/22    Authorization Type UHC-23 visit limit through 10/19/22    Authorization - Visit Number 9    Authorization - Number of Visits 23    PT Start Time 0801    PT Stop Time 0846    PT Time Calculation (min) 45 min    Activity Tolerance Patient tolerated treatment well    Behavior During Therapy WFL for tasks assessed/performed             Past Medical History:  Diagnosis Date   Autonomic dysfunction    with sinus tachycardia and hypotension   Scoliosis     Past Surgical History:  Procedure Laterality Date   ABDOMINAL HYSTERECTOMY     BREAST LUMPECTOMY Bilateral    HERNIA REPAIR     SPINAL FUSION     TONSILLECTOMY      There were no vitals filed for this visit.   Subjective Assessment - 11/29/21 0801     Subjective The needling really helped my Lt scapula.  It loosened up.  My muscles are tight from working an entire day at work yesterday.    Currently in Pain? Yes    Pain Score 4     Pain Location Scapula    Pain Orientation Left    Pain Descriptors / Indicators Tightness    Pain Type Chronic pain    Pain Onset More than a month ago    Pain Frequency Constant    Aggravating Factors  constant, working too long    Pain Relieving Factors stretching, medication                               [facility] Adult PT Treatment/Exercise - 11/29/21 0001       Lumbar Exercises: Stretches   Other Lumbar Stretch Exercise sidebending quadratus stretch over peanut ball s/p DN x 5 each  side    Other Lumbar Stretch Exercise doorway pec/rectus stretch 3x20 seconds      Lumbar Exercises: Aerobic   Nustep L3 x 6' PT presnent to discuss status   shortness of breath at the end of this     Manual Therapy   Manual Therapy Soft tissue mobilization;Myofascial release    Manual therapy comments skilled palpation and monitoring during DN    Soft tissue mobilization elongation and release to bil quadratus and gluteals, Lt scapular mobs and elongation to rhomboids              Trigger Point Dry Needling - 11/29/21 0001     Education Handout Provided Previously provided    Muscles Treated Upper Quadrant Subscapularis;Rhomboids   Lt only   Muscles Treated Back/Hip Gluteus minimus;Gluteus medius;Piriformis;Quadratus lumborum    Rhomboids Response Twitch response elicited;Palpable increased muscle length    Subscapularis Response Twitch response elicited;Palpable increased muscle length    Gluteus Minimus Response Twitch response elicited;Palpable increased muscle length    Gluteus Medius Response Twitch response elicited;Palpable increased muscle length    Piriformis  Response Twitch response elicited;Palpable increased muscle length    Quadratus Lumborum Response Twitch response elicited;Palpable increased muscle length                     PT Short Term Goals - 08/15/21 0815       PT SHORT TERM GOAL #1   Title Pt will learn self massage and assisted massage techniques (husband can help) for improved scar and fascial mobility.    Status Achieved      PT SHORT TERM GOAL #2   Title Pt will be able to don/doff Lt shoe and sock with at least 50% greater ease due to improved Lt hip mobility.    Status Achieved      PT SHORT TERM GOAL #3   Title Pt will be able to participate in aquatic and land-based appointments with min SOB throughout session    Status Achieved      PT SHORT TERM GOAL #4   Title Reduce 5x sit to stand to </= 17 sec    Baseline 10 sec     Status Achieved               PT Long Term Goals - 11/22/21 0813       PT LONG TERM GOAL #1   Title Pt will be able to return to work for at least half-days most days of the week and report 50% improved endurance for this    Baseline working 30-32, fatigue still present, 30% better.    Time 12    Period Weeks    Status Revised    Target Date 02/14/22      PT LONG TERM GOAL #2   Title Pt will feel strong enough to drive [place] distances (>30 minutes) to improve access to work, appointments and errands.    Status Achieved      PT LONG TERM GOAL #3   Title Pt will participate in 6' walk test covering at least 900' with min SOB and BORG RPE >/= 13 to demo improved endurance.    Status Achieved      PT LONG TERM GOAL #4   Title Pt will be ind with advanced HEP and understand how to safely progress    Time 12    Period Weeks    Status On-going    Target Date 02/14/22      PT LONG TERM GOAL #5   Title report > or = to 50-60% overall improved endurance for daily tasks    Baseline 30%    Time 12    Period Weeks    Status Not Met    Target Date 02/14/22      PT LONG TERM GOAL #6   Title report 50-60% improved feeling of stiffness with daily tasks to improve comfort    Time 12    Period Weeks    Status New    Target Date 02/14/22                   Plan - 11/29/21 0811     Clinical Impression Statement Pt had good response to DN last session in particular in the Lt scapular.  Pt continues to exercise as able at home and participate in aquatic PT. Pt reports 20-30% improved mobility since starting DN and 30% improved endurance overall since the start of care.  Pt is now able to walk 1 hour and reports moderate fatigue after.   Pt reports good  response to DN with improved tissue mobility, improved sensation and reduced stiffness overall after last session.  Session today focused on dry needling and manual therapy to address chronic stiffness in the gluteals  and Rt quadratus bilaterally and Lt scapular musculature. Pt with good response to DN with twitch response and improved tissue mobility at the end of the session. Rt quadratus is significantly improved and Lt scapula more mobile.  Pt with significant Lt scapular hypomobility.  Pt with hypersensitivity on the Lt side. Pt will continue to benefit from skilled PT to address chronic fatigue, stiffness and reduced endurance/strength s/p spinal surgery with complications.    PT Frequency 2x / week    PT Duration 12 weeks    PT Treatment/Interventions Aquatic Therapy;ADLs/Self Care Home Management;Electrical Stimulation;Cryotherapy;Moist Heat;Neuromuscular re-education;Balance training;Therapeutic exercise;Therapeutic activities;Functional mobility training;Gait training;Patient/family education;Manual techniques;Dry needling;Scar mobilization;Passive range of motion;Taping;Energy conservation    PT Next Visit Plan Pt will continue through the end of January with land and aquatics with probable transition to [employer].  Might consider additional DN if needed intermittently.    PT Home Exercise Plan Access Code: [email or web address]    Consulted and Agree with Plan of Care Patient             Patient will benefit from skilled therapeutic intervention in order to improve the following deficits and impairments:  Abnormal gait, Decreased range of motion, Increased fascial restricitons, Impaired tone, Postural dysfunction, Decreased strength, Decreased mobility, Impaired flexibility, Hypomobility, Decreased scar mobility, Pain, Decreased activity tolerance, Cardiopulmonary status limiting activity, Decreased endurance, Increased muscle spasms  Visit Diagnosis: Chronic bilateral low back pain without sciatica  Other abnormalities of gait and mobility  Pain in thoracic spine  Muscle weakness (generalized)     Problem List Patient Active Problem List   Diagnosis Date Noted   Scoliosis (and kyphoscoliosis),  idiopathic 02/06/2012  Sigurd Sos, PT 11/29/21 8:49 AM   [place] @ [place], [place], [address] Phone: [phone]   Fax:  [phone]  Name: [name] MRN: [id number] Date of Birth: [birth year]

## 2021-12-02 ENCOUNTER — Encounter: Payer: Self-pay | Admitting: Physical Therapy

## 2021-12-02 ENCOUNTER — Ambulatory Visit: Payer: 59 | Admitting: Physical Therapy

## 2021-12-02 ENCOUNTER — Other Ambulatory Visit: Payer: Self-pay

## 2021-12-02 DIAGNOSIS — G8929 Other chronic pain: Secondary | ICD-10-CM

## 2021-12-02 DIAGNOSIS — R2689 Other abnormalities of gait and mobility: Secondary | ICD-10-CM

## 2021-12-02 DIAGNOSIS — M6281 Muscle weakness (generalized): Secondary | ICD-10-CM

## 2021-12-02 DIAGNOSIS — M546 Pain in thoracic spine: Secondary | ICD-10-CM

## 2021-12-02 DIAGNOSIS — M545 Low back pain, unspecified: Secondary | ICD-10-CM

## 2021-12-02 NOTE — Therapy (Signed)
[facility] @ [address] [place], [place], [address] Phone: [phone]   Fax:  [phone]  Physical Therapy Treatment  Patient Details  Name: [name] MRN: [id number] Date of Birth: 10-Aug-1971 Referring Provider (PT): Sela Hilding, MD (Dr Justin Mend no longer with the practice).   Encounter Date: 12/02/2021   PT End of Session - 12/02/21 1423     Visit Number 29    Date for PT Re-Evaluation 02/14/22    Authorization Type UHC-23 visit limit through 10/19/22    Authorization Time Period insurance rolls over 10/20/21    Authorization - Visit Number 10    Authorization - Number of Visits 23    PT Start Time 2683    PT Stop Time 1430    PT Time Calculation (min) 45 min    Activity Tolerance Patient tolerated treatment well    Behavior During Therapy WFL for tasks assessed/performed             Past Medical History:  Diagnosis Date   Autonomic dysfunction    with sinus tachycardia and hypotension   Scoliosis     Past Surgical History:  Procedure Laterality Date   ABDOMINAL HYSTERECTOMY     BREAST LUMPECTOMY Bilateral    HERNIA REPAIR     SPINAL FUSION     TONSILLECTOMY      There were no vitals filed for this visit.   Subjective Assessment - 12/02/21 1426     Subjective Feeling very good today. Tough week with my heart, a lot of racing.    Pertinent History 02/14/21 spinal fusion posterior thoracic T4-L4 performed by Dr Atilano Ina of [facility] working with cardiology and started Beta Blocker to address autonomic dysfunction for tachycardia and PVCs, ongoing Lt shoulder pain, Lt UE resting tremor since surgery    Pain Descriptors / Indicators Tightness             Patient seen for aquatic therapy today.  Treatment took place in water 3.5-4.5 feet deep depending upon activity.  Pt entered the pool via stairs with mild use of rails. Water temp 92 degrees F. Pt requires buoyancy of water for  support and to offload joints with strengthening exercises.  Pt utilizes viscosity of the water required for strengthening.   Seated water bench with 75% submersion Pt performed seated LE AROM exercises 20x in all planes, added 2# to LAQ 15x Bil  Water walking 10x in each direction with running arms. Side stepping with hand paddles and mini squat 10x. Hand paddles for shoulder flex/ext 10x.Breast stroke with noodle under abdomin 6x long length of pool. Horseback underwater bicycle 1 min 3x , horseback hip abd/add 10x in between sets. Used yellow noodle for horseback. Bil hip ext/flex 20x                                 PT Long Term Goals - 11/22/21 0813       PT LONG TERM GOAL #1   Title Pt will be able to return to work for at least half-days most days of the week and report 50% improved endurance for this    Baseline working 30-32, fatigue still present, 30% better.    Time 12    Period Weeks    Status Revised    Target Date 02/14/22      PT LONG TERM GOAL #2   Title Pt will  feel strong enough to drive [place] distances (>30 minutes) to improve access to work, appointments and errands.    Status Achieved      PT LONG TERM GOAL #3   Title Pt will participate in 6' walk test covering at least 900' with min SOB and BORG RPE >/= 13 to demo improved endurance.    Status Achieved      PT LONG TERM GOAL #4   Title Pt will be ind with advanced HEP and understand how to safely progress    Time 12    Period Weeks    Status On-going    Target Date 02/14/22      PT LONG TERM GOAL #5   Title report > or = to 50-60% overall improved endurance for daily tasks    Baseline 30%    Time 12    Period Weeks    Status Not Met    Target Date 02/14/22      PT LONG TERM GOAL #6   Title report 50-60% improved feeling of stiffness with daily tasks to improve comfort    Time 12    Period Weeks    Status New    Target Date 02/14/22                    Plan - 12/02/21 1426     Clinical Impression Statement Pt arrives today for aquatic PT with normal tightness but no pain. Pt is able to swim the breast stroke with small noodle at chest for flotation support 6x the long direction of the pool. Pt was also able to perform her bicycle in horseback position ( as opposed to seated) 3x 1 min due to increased trunk strength. Pt will officially join [employer] next week so she can get in the pool to exercise more often.    Personal Factors and Comorbidities Comorbidity 1;Time since onset of injury/illness/exacerbation;Transportation;Comorbidity 2    Comorbidities autonomic dysregulation, spinal fusion T4-L4    Examination-Activity Limitations Locomotion Level;Bed Mobility;Bend;Sit;Stand;Dressing    Examination-Participation Restrictions Driving;Community Activity;Cleaning;Meal Prep;Occupation    Stability/Clinical Decision Making Evolving/Moderate complexity    Rehab Potential Good    PT Frequency 2x / week    PT Duration 12 weeks    PT Treatment/Interventions Aquatic Therapy;ADLs/Self Care Home Management;Electrical Stimulation;Cryotherapy;Moist Heat;Neuromuscular re-education;Balance training;Therapeutic exercise;Therapeutic activities;Functional mobility training;Gait training;Patient/family education;Manual techniques;Dry needling;Scar mobilization;Passive range of motion;Taping;Energy conservation    PT Next Visit Plan Pt will continue through the end of January with land and aquatics with probable transition to [employer].  Might consider additional DN if needed intermittently.    PT Home Exercise Plan Access Code: [email or web address]    Consulted and Agree with Plan of Care Patient             Patient will benefit from skilled therapeutic intervention in order to improve the following deficits and impairments:  Abnormal gait, Decreased range of motion, Increased fascial restricitons, Impaired tone, Postural dysfunction, Decreased strength, Decreased mobility,  Impaired flexibility, Hypomobility, Decreased scar mobility, Pain, Decreased activity tolerance, Cardiopulmonary status limiting activity, Decreased endurance, Increased muscle spasms  Visit Diagnosis: Chronic bilateral low back pain without sciatica  Other abnormalities of gait and mobility  Pain in thoracic spine  Muscle weakness (generalized)     Problem List Patient Active Problem List   Diagnosis Date Noted   Scoliosis (and kyphoscoliosis), idiopathic 02/06/2012    Sylvan Sookdeo, PTA 12/02/2021, 2:48 PM  [facility] @ [place]  [place], [place], [address] Phone: [phone]   Fax:  [phone]  Name: [name] MRN: [id number] Date of Birth: Jan 28, 1971

## 2021-12-06 ENCOUNTER — Other Ambulatory Visit: Payer: Self-pay

## 2021-12-06 ENCOUNTER — Ambulatory Visit: Payer: 59

## 2021-12-06 DIAGNOSIS — M545 Low back pain, unspecified: Secondary | ICD-10-CM | POA: Diagnosis not present

## 2021-12-06 DIAGNOSIS — R2689 Other abnormalities of gait and mobility: Secondary | ICD-10-CM

## 2021-12-06 DIAGNOSIS — M546 Pain in thoracic spine: Secondary | ICD-10-CM

## 2021-12-06 DIAGNOSIS — M6281 Muscle weakness (generalized): Secondary | ICD-10-CM

## 2021-12-06 DIAGNOSIS — G8929 Other chronic pain: Secondary | ICD-10-CM

## 2021-12-06 NOTE — Patient Instructions (Signed)
Access Code: [email or web address] URL: [email or web address] Date: 12/06/2021 Prepared by: Claiborne Billings   Seated Shoulder Horizontal Abduction with Resistance - Thumbs Up - 1 x daily - 7 x weekly - 1-2 sets - 10 reps Standing Shoulder Single Arm PNF D2 Flexion with Resistance - 1 x daily - 7 x weekly - 1-2 sets - 10 reps

## 2021-12-06 NOTE — Therapy (Signed)
[facility] @ [place] [place], [place], [address] Phone: [phone]   Fax:  [phone]  Physical Therapy Treatment  Patient Details  Name: Mckenize Mezera MRN: [id number] Date of Birth: 1971-04-12 Referring Provider (PT): Sela Hilding, MD (Dr Justin Mend no longer with the practice).   Encounter Date: 12/06/2021   PT End of Session - 12/06/21 0841     Visit Number 30    Date for PT Re-Evaluation 02/14/22    Authorization Type UHC-23 visit limit through 10/19/22    Authorization - Visit Number 11    Authorization - Number of Visits 23    PT Start Time 0800    PT Stop Time 0843    PT Time Calculation (min) 43 min    Activity Tolerance Patient tolerated treatment well    Behavior During Therapy WFL for tasks assessed/performed             Past Medical History:  Diagnosis Date   Autonomic dysfunction    with sinus tachycardia and hypotension   Scoliosis     Past Surgical History:  Procedure Laterality Date   ABDOMINAL HYSTERECTOMY     BREAST LUMPECTOMY Bilateral    HERNIA REPAIR     SPINAL FUSION     TONSILLECTOMY      There were no vitals filed for this visit.   Subjective Assessment - 12/06/21 0805     Subjective I am feeling very stiff today.  I want to work on flexibility and UE strength today.    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)                               [facility] Adult PT Treatment/Exercise - 12/06/21 0001       Lumbar Exercises: Stretches   Active Hamstring Stretch Left;Right;2 reps;20 seconds     Figure 4 Stretch 2 reps;20 seconds;Seated    Other Lumbar Stretch Exercise sidebending quadratus stretch over peanut ball s/p DN x 5 each side      Lumbar Exercises: Aerobic   Nustep L3 x 6' PT presnent to discuss status   shortness of breath at the end of this     Lumbar Exercises: Seated   Other Seated Lumbar Exercises horizontal abduction and D2 with red band: seaded with ab bracing 2x10 bil each- added to HEP      Shoulder Exercises: Seated   Other Seated Exercises 3 way raises: 1# x10 each      Manual Therapy   Manual Therapy Soft tissue mobilization;Myofascial release    Manual therapy comments skilled palpation and monitoring during DN    Soft tissue mobilization Rt quadratus and gluteals              Trigger Point Dry Needling - 12/06/21 0001     Consent Given? Yes    Education Handout Provided Previously provided    Muscles Treated Back/Hip Gluteus minimus;Gluteus medius;Piriformis;Quadratus lumborum   Rt only   Gluteus Minimus Response Twitch response elicited;Palpable increased muscle length    Gluteus Medius Response  Twitch response elicited;Palpable increased muscle length    Quadratus Lumborum Response Twitch response elicited;Palpable increased muscle length                   PT Education - 12/06/21 0819     Education Details Access Code: [email or web address]    Person(s) Educated Patient    Methods Explanation;Demonstration;Handout    Comprehension Verbalized understanding;Returned demonstration              PT Short Term Goals - 08/15/21 0815       PT SHORT TERM GOAL #1   Title Pt will learn self massage and assisted massage techniques (husband can help) for improved scar and fascial mobility.    Status Achieved      PT SHORT TERM GOAL #2   Title Pt will be able to don/doff Lt shoe and sock with at least 50% greater ease due to improved Lt hip mobility.    Status Achieved      PT SHORT TERM GOAL #3   Title Pt will be able to participate in  aquatic and land-based appointments with min SOB throughout session    Status Achieved      PT SHORT TERM GOAL #4   Title Reduce 5x sit to stand to </= 17 sec    Baseline 10 sec    Status Achieved               PT Long Term Goals - 11/22/21 0813       PT LONG TERM GOAL #1   Title Pt will be able to return to work for at least half-days most days of the week and report 50% improved endurance for this    Baseline working 30-32, fatigue still present, 30% better.    Time 12    Period Weeks    Status Revised    Target Date 02/14/22      PT LONG TERM GOAL #2   Title Pt will feel strong enough to drive [place] distances (>30 minutes) to improve access to work, appointments and errands.    Status Achieved      PT LONG TERM GOAL #3   Title Pt will participate in 6' walk test covering at least 900' with min SOB and BORG RPE >/= 13 to demo improved endurance.    Status Achieved      PT LONG TERM GOAL #4   Title Pt will be ind with advanced HEP and understand how to safely progress    Time 12    Period Weeks    Status On-going    Target Date 02/14/22      PT LONG TERM GOAL #5   Title report > or = to 50-60% overall improved endurance for daily tasks    Baseline 30%    Time 12    Period Weeks    Status Not Met    Target Date 02/14/22      PT LONG TERM GOAL #6   Title report 50-60% improved feeling of stiffness with daily tasks to improve comfort    Time 12    Period Weeks    Status New    Target Date 02/14/22                   Plan - 12/06/21 0818     Clinical Impression Statement Pt had good response to DN last session in particular in the Lt scapular region.  Pt continues to exercise as able at  home and participate in aquatic PT. Pt reports 20-30% improved mobility since starting DN and 30% improved endurance overall since the start of care.   Session today focused on dry needling and manual therapy to address chronic stiffness in the gluteals and Rt  quadratus. Pt with good response to DN with twitch response and improved tissue mobility at the end of the session. PT added to HEP for core and shoulder strength. Pt will continue to benefit from skilled PT to address chronic fatigue, stiffness and reduced endurance/strength s/p spinal surgery with complications.    PT Frequency 2x / week    PT Duration 12 weeks    PT Treatment/Interventions Aquatic Therapy;ADLs/Self Care Home Management;Electrical Stimulation;Cryotherapy;Moist Heat;Neuromuscular re-education;Balance training;Therapeutic exercise;Therapeutic activities;Functional mobility training;Gait training;Patient/family education;Manual techniques;Dry needling;Scar mobilization;Passive range of motion;Taping;Energy conservation    PT Next Visit Plan Pt will continue through the end of January with land and aquatics with probable transition to [employer].  Might consider additional DN if needed intermittently.    Consulted and Agree with Plan of Care Patient             Patient will benefit from skilled therapeutic intervention in order to improve the following deficits and impairments:  Abnormal gait, Decreased range of motion, Increased fascial restricitons, Impaired tone, Postural dysfunction, Decreased strength, Decreased mobility, Impaired flexibility, Hypomobility, Decreased scar mobility, Pain, Decreased activity tolerance, Cardiopulmonary status limiting activity, Decreased endurance, Increased muscle spasms  Visit Diagnosis: Chronic bilateral low back pain without sciatica  Pain in thoracic spine  Other abnormalities of gait and mobility  Muscle weakness (generalized)     Problem List Patient Active Problem List   Diagnosis Date Noted   Scoliosis (and kyphoscoliosis), idiopathic 02/06/2012   Sigurd Sos, PT 12/06/21 8:45 AM  [facility] @ [place], [place], [address] Phone: [phone]   Fax:   [phone]  Name: Katheline Brendlinger MRN: [id number] Date of Birth: 06-08-1971

## 2021-12-09 ENCOUNTER — Ambulatory Visit: Payer: 59 | Admitting: Physical Therapy

## 2021-12-09 ENCOUNTER — Encounter: Payer: Self-pay | Admitting: Physical Therapy

## 2021-12-09 ENCOUNTER — Other Ambulatory Visit: Payer: Self-pay

## 2021-12-09 DIAGNOSIS — M545 Low back pain, unspecified: Secondary | ICD-10-CM

## 2021-12-09 DIAGNOSIS — M546 Pain in thoracic spine: Secondary | ICD-10-CM

## 2021-12-09 DIAGNOSIS — R2689 Other abnormalities of gait and mobility: Secondary | ICD-10-CM

## 2021-12-09 DIAGNOSIS — G8929 Other chronic pain: Secondary | ICD-10-CM

## 2021-12-09 DIAGNOSIS — M6281 Muscle weakness (generalized): Secondary | ICD-10-CM

## 2021-12-09 NOTE — Therapy (Signed)
[facility] @ [place], [place], [address] Phone: [phone]   Fax:  [phone]  Physical Therapy Treatment  Patient Details  Name: [name] MRN: [id number] Date of Birth: 1971/03/02 Referring Provider (PT): Sela Hilding, MD (Dr Justin Mend no longer with the practice).   Encounter Date: 12/09/2021   PT End of Session - 12/09/21 1431     Visit Number 31    Date for PT Re-Evaluation 02/14/22    Authorization Type UHC-23 visit limit through 10/19/22    Authorization - Visit Number 12    Authorization - Number of Visits 23    PT Start Time 5188    PT Stop Time 1430    PT Time Calculation (min) 45 min    Activity Tolerance Patient tolerated treatment well    Behavior During Therapy WFL for tasks assessed/performed             Past Medical History:  Diagnosis Date   Autonomic dysfunction    with sinus tachycardia and hypotension   Scoliosis     Past Surgical History:  Procedure Laterality Date   ABDOMINAL HYSTERECTOMY     BREAST LUMPECTOMY Bilateral    HERNIA REPAIR     SPINAL FUSION     TONSILLECTOMY      There were no vitals filed for this visit.   Subjective Assessment - 12/09/21 1430     Subjective Last aquatic session was a dousey! It was hard on my body but  want to do it again. Pt worked all week in the office.    Pertinent History 02/14/21 spinal fusion posterior thoracic T4-L4 performed by Dr Atilano Ina of [facility], working with cardiology and started Beta Blocker to address autonomic dysfunction for tachycardia and PVCs, ongoing Lt shoulder pain, Lt UE resting tremor since surgery    Currently in Pain? --   general tightness           Treatment: Patient seen for aquatic therapy today.  Treatment took place in water 3.5-4.5 feet deep depending upon activity.  Pt entered the pool via stairs with mid use of rails. Water temp 90 degrees F. Pt requires buoyancy of water for  support and to offload joints with strengthening exercises.    Standing 75% depth: 10x water walking with runners arms, handpaddles for side stepping/mini squat 10x, Breast stroke 10x lengths with small noodle under chest, horseback bicycle on yellow nooodle x 5 min, then hip add/abd 2x10, Hip 3 ways Bil 20x each, shoulder horizontal abd with double UE wts 20x. Seated decompression x 3 min with yellow noodle.                               PT Short Term Goals - 08/15/21 0815       PT SHORT TERM GOAL #1   Title Pt will learn self massage and assisted massage techniques (husband can help) for improved scar and fascial mobility.    Status Achieved      PT SHORT TERM GOAL #2   Title Pt will be able to don/doff Lt shoe and sock with at least 50% greater ease due to improved Lt hip mobility.    Status Achieved      PT SHORT TERM GOAL #3   Title Pt will be able to participate in aquatic and land-based appointments with min SOB throughout session    Status Achieved  PT SHORT TERM GOAL #4   Title Reduce 5x sit to stand to </= 17 sec    Baseline 10 sec    Status Achieved               PT Long Term Goals - 11/22/21 0813       PT LONG TERM GOAL #1   Title Pt will be able to return to work for at least half-days most days of the week and report 50% improved endurance for this    Baseline working 30-32, fatigue still present, 30% better.    Time 12    Period Weeks    Status Revised    Target Date 02/14/22      PT LONG TERM GOAL #2   Title Pt will feel strong enough to drive [place] distances (>30 minutes) to improve access to work, appointments and errands.    Status Achieved      PT LONG TERM GOAL #3   Title Pt will participate in 6' walk test covering at least 900' with min SOB and BORG RPE >/= 13 to demo improved endurance.    Status Achieved      PT LONG TERM GOAL #4   Title Pt will be ind with advanced HEP and understand how to safely  progress    Time 12    Period Weeks    Status On-going    Target Date 02/14/22      PT LONG TERM GOAL #5   Title report > or = to 50-60% overall improved endurance for daily tasks    Baseline 30%    Time 12    Period Weeks    Status Not Met    Target Date 02/14/22      PT LONG TERM GOAL #6   Title report 50-60% improved feeling of stiffness with daily tasks to improve comfort    Time 12    Period Weeks    Status New    Target Date 02/14/22                   Plan - 12/09/21 1432     Clinical Impression Statement Pt swam 10 lengths today of a modified breast stroke and bicycled in the horseback position for a consecutive 5 min. Pt had fatigue but was comensurate to the activity she was performing. No pain or other complaints.    Personal Factors and Comorbidities Comorbidity 1;Time since onset of injury/illness/exacerbation;Transportation;Comorbidity 2    Comorbidities autonomic dysregulation, spinal fusion T4-L4    Examination-Activity Limitations Locomotion Level;Bed Mobility;Bend;Sit;Stand;Dressing    Stability/Clinical Decision Making Evolving/Moderate complexity    Rehab Potential Good    PT Frequency 2x / week    PT Duration 12 weeks    PT Next Visit Plan aquatics 1x more    PT Home Exercise Plan Access Code: [email or web address]    Consulted and Agree with Plan of Care Patient             Patient will benefit from skilled therapeutic intervention in order to improve the following deficits and impairments:  Abnormal gait, Decreased range of motion, Increased fascial restricitons, Impaired tone, Postural dysfunction, Decreased strength, Decreased mobility, Impaired flexibility, Hypomobility, Decreased scar mobility, Pain, Decreased activity tolerance, Cardiopulmonary status limiting activity, Decreased endurance, Increased muscle spasms  Visit Diagnosis: Chronic bilateral low back pain without sciatica  Pain in thoracic spine  Other abnormalities of gait and  mobility  Muscle weakness (generalized)     Problem List  Patient Active Problem List   Diagnosis Date Noted   Scoliosis (and kyphoscoliosis), idiopathic 02/06/2012    [name], PTA 12/09/2021, 3:30 PM  [name] @ [place], [place], [address] Phone: [phone]   Fax:  [phone]  Name: [name] MRN: [id number] Date of Birth: November 16, 1971

## 2021-12-13 ENCOUNTER — Other Ambulatory Visit: Payer: Self-pay

## 2021-12-13 ENCOUNTER — Ambulatory Visit: Payer: 59

## 2021-12-13 DIAGNOSIS — M545 Low back pain, unspecified: Secondary | ICD-10-CM | POA: Diagnosis not present

## 2021-12-13 DIAGNOSIS — M546 Pain in thoracic spine: Secondary | ICD-10-CM

## 2021-12-13 DIAGNOSIS — G8929 Other chronic pain: Secondary | ICD-10-CM

## 2021-12-13 DIAGNOSIS — R2689 Other abnormalities of gait and mobility: Secondary | ICD-10-CM

## 2021-12-13 DIAGNOSIS — M6281 Muscle weakness (generalized): Secondary | ICD-10-CM

## 2021-12-13 NOTE — Therapy (Signed)
[facility] @ [facility] [place], [place], [address] Phone: [phone]   Fax:  [phone]  Physical Therapy Treatment  Patient Details  Name: [name] MRN: [id number] Date of Birth: 05/01/1971 Referring Provider (PT): Sela Hilding, MD (Dr Justin Mend no longer with the practice).   Encounter Date: 12/13/2021   PT End of Session - 12/13/21 0842     Visit Number 32    Date for PT Re-Evaluation 02/14/22    Authorization Type [employer]-23 visit limit through 10/19/22    PT Start Time 0800    PT Stop Time 0842    PT Time Calculation (min) 42 min    Activity Tolerance Patient tolerated treatment well    Behavior During Therapy WFL for tasks assessed/performed             Past Medical History:  Diagnosis Date   Autonomic dysfunction    with sinus tachycardia and hypotension   Scoliosis     Past Surgical History:  Procedure Laterality Date   ABDOMINAL HYSTERECTOMY     BREAST LUMPECTOMY Bilateral    HERNIA REPAIR     SPINAL FUSION     TONSILLECTOMY      There were no vitals filed for this visit.   Subjective Assessment - 12/13/21 0800     Subjective My Lt shoulder blade is sore again.  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    Currently in Pain? No/denies   very stiff                              [facility] Adult PT Treatment/Exercise - 12/13/21 0001       Lumbar Exercises: Aerobic   Nustep L3 x 6' PT presnent to discuss status   shortness of breath at the end of this     Shoulder Exercises: Seated   Other Seated Exercises 3 way raises: 1# x10 each- seated on green ball      Manual Therapy   Manual Therapy Soft tissue mobilization;Myofascial release    Manual therapy comments skilled palpation and monitoring during DN    Soft tissue mobilization bil gluteals and Lt scapular musculature              Trigger Point Dry Needling -  12/13/21 0001     Consent Given? Yes    Education Handout Provided Previously provided    Muscles Treated Upper Quadrant Subscapularis;Rhomboids   Lt only   Muscles Treated Back/Hip Gluteus minimus;Gluteus medius;Piriformis;Quadratus lumborum   Rt only   Rhomboids Response Twitch response elicited;Palpable increased muscle length    Subscapularis Response Twitch response elicited;Palpable increased muscle length    Gluteus Minimus Response Twitch response elicited;Palpable increased muscle length    Gluteus Medius Response Twitch response elicited;Palpable increased muscle length                     PT Short Term Goals - 08/15/21 0815       PT SHORT TERM GOAL #1   Title Pt will learn self massage and assisted massage techniques (husband can help) for improved scar and fascial mobility.    Status Achieved      PT SHORT TERM GOAL #2   Title Pt will be able to don/doff Lt shoe and sock with at least 50% greater ease due to improved Lt hip mobility.  Status Achieved      PT SHORT TERM GOAL #3   Title Pt will be able to participate in aquatic and land-based appointments with min SOB throughout session    Status Achieved      PT SHORT TERM GOAL #4   Title Reduce 5x sit to stand to </= 17 sec    Baseline 10 sec    Status Achieved               PT Long Term Goals - 11/22/21 0813       PT LONG TERM GOAL #1   Title Pt will be able to return to work for at least half-days most days of the week and report 50% improved endurance for this    Baseline working 30-32, fatigue still present, 30% better.    Time 12    Period Weeks    Status Revised    Target Date 02/14/22      PT LONG TERM GOAL #2   Title Pt will feel strong enough to drive [place] distances (>30 minutes) to improve access to work, appointments and errands.    Status Achieved      PT LONG TERM GOAL #3   Title Pt will participate in 6' walk test covering at least 900' with min SOB and BORG RPE  >/= 13 to demo improved endurance.    Status Achieved      PT LONG TERM GOAL #4   Title Pt will be ind with advanced HEP and understand how to safely progress    Time 12    Period Weeks    Status On-going    Target Date 02/14/22      PT LONG TERM GOAL #5   Title report > or = to 50-60% overall improved endurance for daily tasks    Baseline 30%    Time 12    Period Weeks    Status Not Met    Target Date 02/14/22      PT LONG TERM GOAL #6   Title report 50-60% improved feeling of stiffness with daily tasks to improve comfort    Time 12    Period Weeks    Status New    Target Date 02/14/22                   Plan - 12/13/21 0813     Clinical Impression Statement Pt had good response to DN last session in particular in the Lt scapular region and reports that pain is elevated in this region again today.   Pt continues to exercise as able at home and participate in aquatic PT. Pt reports 20-30% improved mobility since starting DN and 30% improved endurance overall since the start of care.   Session today focused on exercise progression and dry needling and manual therapy to address chronic stiffness in the gluteals, Lt scapular musculature and Rt quadratus. Pt with good response to DN with twitch response and improved tissue mobility at the end of the session. Pt will continue to benefit from skilled PT to address chronic fatigue, stiffness and reduced endurance/strength s/p spinal surgery with complications.    PT Frequency 2x / week    PT Duration 12 weeks    PT Treatment/Interventions Aquatic Therapy;ADLs/Self Care Home Management;Electrical Stimulation;Cryotherapy;Moist Heat;Neuromuscular re-education;Balance training;Therapeutic exercise;Therapeutic activities;Functional mobility training;Gait training;Patient/family education;Manual techniques;Dry needling;Scar mobilization;Passive range of motion;Taping;Energy conservation    PT Next Visit Plan 1 more week probable.   Possible hold until end of  POC in case pt needs additional DN    PT Home Exercise Plan Access Code: [email or web address]    Consulted and Agree with Plan of Care Patient             Patient will benefit from skilled therapeutic intervention in order to improve the following deficits and impairments:  Abnormal gait, Decreased range of motion, Increased fascial restricitons, Impaired tone, Postural dysfunction, Decreased strength, Decreased mobility, Impaired flexibility, Hypomobility, Decreased scar mobility, Pain, Decreased activity tolerance, Cardiopulmonary status limiting activity, Decreased endurance, Increased muscle spasms  Visit Diagnosis: Chronic bilateral low back pain without sciatica  Other abnormalities of gait and mobility  Pain in thoracic spine  Muscle weakness (generalized)     Problem List Patient Active Problem List   Diagnosis Date Noted   Scoliosis (and kyphoscoliosis), idiopathic 02/06/2012    Sigurd Sos, PT 12/13/21 8:46 AM   [facility] @ [place], [place], [address] Phone: [phone]   Fax:  [phone]  Name: [name] MRN: [id number] Date of Birth: 1971-06-24

## 2021-12-16 ENCOUNTER — Ambulatory Visit: Payer: 59 | Admitting: Physical Therapy

## 2021-12-16 ENCOUNTER — Telehealth: Payer: Self-pay | Admitting: Cardiology

## 2021-12-16 NOTE — Telephone Encounter (Signed)
Pt returning nurse [place] call.. please advise

## 2021-12-16 NOTE — Telephone Encounter (Signed)
Patient c/o Palpitations:  High priority if patient c/o lightheadedness, shortness of breath, or chest pain  How long have you had palpitations/irregular HR/ Afib? Are you having the symptoms now? PVC's have ramped up in the last couple of weeks   Are you currently experiencing lightheadedness, SOB or CP? No   Do you have a history of afib (atrial fibrillation) or irregular heart rhythm? Yes   Have you checked your BP or HR? (document readings if available):  12/16/21 93/70 HR 60 and irregular  (HR normally runs in the 90's)  Are you experiencing any other symptoms? Mainly SOB upon exertion or when sitting up straight, fatigue, and feeling faint when standing up   Been a problem since her surgery, but is getting worse.

## 2021-12-16 NOTE — Telephone Encounter (Signed)
Left VM for pt to call back.

## 2021-12-16 NOTE — Telephone Encounter (Signed)
Spoke with pt who states that she is having increased palpations. Pt states it feels irregular and caused her to be short of breath. Appointment was moved to 12/26/21. Advised about kardia mobile and info sent via MyChart. Pt advised to go to the ED for worsening or concerning sx. Pt verbalized understanding and had no additional questions.

## 2021-12-19 ENCOUNTER — Emergency Department (HOSPITAL_COMMUNITY)
Admission: EM | Admit: 2021-12-19 | Discharge: 2021-12-19 | Disposition: A | Discharge status: Home / Self Care | Payer: 59 | Attending: Emergency Medicine | Admitting: Emergency Medicine

## 2021-12-19 ENCOUNTER — Other Ambulatory Visit: Payer: Self-pay

## 2021-12-19 DIAGNOSIS — R002 Palpitations: Secondary | ICD-10-CM | POA: Insufficient documentation

## 2021-12-19 DIAGNOSIS — R0602 Shortness of breath: Secondary | ICD-10-CM | POA: Diagnosis not present

## 2021-12-19 LAB — COMPREHENSIVE METABOLIC PANEL
ALT: 14 U/L (ref 0–44)
AST: 20 U/L (ref 15–41)
Albumin: 4.4 g/dL (ref 3.5–5.0)
Alkaline Phosphatase: 46 U/L (ref 38–126)
Anion gap: 9 (ref 5–15)
BUN: 17 mg/dL (ref 6–20)
CO2: 29 mmol/L (ref 22–32)
Calcium: 10 mg/dL (ref 8.9–10.3)
Chloride: 100 mmol/L (ref 98–111)
Creatinine, Ser: 0.75 mg/dL (ref 0.44–1.00)
GFR, Estimated: >60 mL/min (ref >60)
Glucose, Bld: 114 mg/dL — ABNORMAL HIGH (ref 70–99)
Potassium: 4.1 mmol/L (ref 3.5–5.1)
Sodium: 138 mmol/L (ref 135–145)
Total Bilirubin: 0.5 mg/dL (ref 0.3–1.2)
Total Protein: 6.9 g/dL (ref 6.5–8.1)

## 2021-12-19 LAB — CBC WITH DIFFERENTIAL/PLATELET
Abs Immature Granulocytes: 0.01 10*3/uL (ref 0.00–0.07)
Basophils Absolute: 0 10*3/uL (ref 0.0–0.1)
Basophils Relative: 0 %
Eosinophils Absolute: 0.1 10*3/uL (ref 0.0–0.5)
Eosinophils Relative: 1 %
HCT: 38.4 % (ref 36.0–46.0)
Hemoglobin: 12.7 g/dL (ref 12.0–15.0)
Immature Granulocytes: 0 %
Lymphocytes Relative: 27 %
Lymphs Abs: 1.9 10*3/uL (ref 0.7–4.0)
MCH: 32.5 pg (ref 26.0–34.0)
MCHC: 33.1 g/dL (ref 30.0–36.0)
MCV: 98.2 fL (ref 80.0–100.0)
Monocytes Absolute: 0.4 10*3/uL (ref 0.1–1.0)
Monocytes Relative: 6 %
Neutro Abs: 4.6 10*3/uL (ref 1.7–7.7)
Neutrophils Relative %: 66 %
Platelets: 227 10*3/uL (ref 150–400)
RBC: 3.91 MIL/uL (ref 3.87–5.11)
RDW: 12 % (ref 11.5–15.5)
WBC: 7.1 10*3/uL (ref 4.0–10.5)
nRBC: 0 % (ref 0.0–0.2)

## 2021-12-19 LAB — TROPONIN I (HIGH SENSITIVITY)
Troponin I (High Sensitivity): 3 ng/L (ref <18)
Troponin I (High Sensitivity): <2 ng/L (ref <18)

## 2021-12-19 NOTE — ED Triage Notes (Signed)
Pt BIB EMS for recurring episodes of tachycardia/palpitations after spinal surgery last year. Today pt was at work and noted her HR was in the 40s so she did not take her prescribed beta blocker. Pt did take prescribed midodrine. Pt c/o shortness of breath with the PVCs from not taking her beta blocker.  18 L AC  180/98 HR 90 with occasional PVC/trigeminy

## 2021-12-19 NOTE — Discharge Instructions (Addendum)
It was a pleasure taking care of you today!   Your work-up was negative in the ED today.  Call your cardiologist, Dr. Joya Gaskins office today to schedule a follow-up appointment regarding today's ED visit.  Maintain your follow-up appointment with the pulmonologist.  You may follow-up with your primary care provider as needed.  Return to the ED if you are experiencing increasing/worsening chest pain, trouble breathing, worsening symptoms.

## 2021-12-19 NOTE — ED Provider Notes (Signed)
[facility] EMERGENCY DEPARTMENT Provider Note   CSN: [id number] Arrival date & time: 12/19/21  1036     History  Chief Complaint  Patient presents with   Palpitations    [name] is a 51 y.o. female with a PMHx of POTS who presents to the ED brought in by EMS complaining of recurrent episodes of palpitations onset 1 year.  While she was at work today she noted her heart rate was in the 40s so she did not take her prescribed beta-blocker.  She did take her midodrine. Pt also reached out to her Cardiologist this morning regarding her symptoms prior to coming into the ED.  Has associated shortness of breath and presyncope. These symptoms are not new for her and have been ongoing since last March 2022 following a spinal surgery. Pt notes that her medical team are aware of her symptoms. She has worn a Holter monitor over the summer of 2022 as well. She has been evaluated by her Cardiologist, Electrophysiologist, and has an upcoming appointment with a Pulmonologist as well. Denies chest pain, nausea, vomiting, fever, chills, dizziness, lightheadedness.   The history is provided by the patient. No language interpreter was used.  Palpitations Associated symptoms: shortness of breath   Associated symptoms: no chest pain, no dizziness, no nausea and no vomiting          Home Medications Prior to Admission medications   Medication Sig Start Date End Date Taking? Authorizing Provider  acebutolol (SECTRAL) 200 MG capsule Take 1 capsule (200 mg total) by mouth 2 (two) times daily. 10/12/21   Richardo Priest, MD  acetaminophen (TYLENOL) 650 MG CR tablet Take 1,300 mg by mouth every 8 (eight) hours as needed for pain.    [provider]  ascorbic acid (VITAMIN C) 1000 MG tablet Take 1,000 mg by mouth daily.    [provider]  B Complex Vitamins (VITAMIN B COMPLEX PO) Take 1 tablet by mouth daily.    [provider]  CALCIUM PO Take 1,000  mg by mouth daily.    [provider]  Cholecalciferol (VITAMIN D3 PO) Take 1 tablet by mouth daily.    [provider]  diazepam (VALIUM) 5 MG tablet Take 5 mg by mouth in the morning, at noon, and at bedtime. 03/14/21   [provider]  diphenhydrAMINE (SOMINEX) 25 MG tablet Take 25 mg by mouth at bedtime as needed for sleep.    [provider]  Ibuprofen 200 MG CAPS Take 2 capsules by mouth every 6 (six) hours as needed for pain.    [provider]  magnesium oxide (MAG-OX) 400 MG tablet Take 400 mg by mouth daily.    [provider]  midodrine (PROAMATINE) 10 MG tablet Take 1 tablet (10 mg total) by mouth 2 (two) times daily. 10/12/21   Richardo Priest, MD  Probiotic Product (PROBIOTIC DAILY PO) Take 1 capsule by mouth daily.    [provider]      Allergies    Ciprofloxacin, Fluoxetine, Robaxin [methocarbamol], Sertraline, and Venlafaxine    Review of Systems   Review of Systems  Constitutional:  Negative for chills and fever.  Respiratory:  Positive for shortness of breath.   Cardiovascular:  Positive for palpitations. Negative for chest pain.  Gastrointestinal:  Negative for abdominal pain, nausea and vomiting.  Skin:  Negative for rash.  Neurological:  Negative for dizziness, syncope and light-headedness.  All other systems reviewed and are negative.  Physical Exam Updated Vital Signs BP (!) 171/96 (BP Location: Right Arm)    Pulse 70    Temp 97.6 F (36.4 C) (Oral)    Resp 16    Ht 5\' 6"  (1.676 m)    Wt 59 kg    SpO2 100%    BMI 20.98 kg/m  Physical Exam  ED Results / Procedures / Treatments   Labs (all labs ordered are listed, but only abnormal results are displayed) Labs Reviewed  COMPREHENSIVE METABOLIC PANEL  CBC WITH DIFFERENTIAL/PLATELET  TROPONIN I (HIGH SENSITIVITY)    EKG None  Radiology No results found.  Procedures Procedures    Medications Ordered in ED Medications - No data to  display  ED Course/ Medical Decision Making/ A&P Clinical Course as of 12/19/21 1757  Mon Dec 19, 2021  1148 cards [SB]  1213 Reviewed lab findings with patient at bedside along with attending.  Discussed with patient will follow up with cardiology regarding recommendations, however, patient can likely be discharged. [SB]  1235 Consult with trish who notes that they will have the cardiologist Dr. Marlou Porch call for further evaluation of the symptoms [SB]  47 Consult with Dr. Marlou Porch who agrees with discharge home with cardiology follow up [SB]  1403 Discussed with patient at bedside regarding cardiologist recommendations for discharge home and follow-up with cardiology.  Discussed discharge treatment plan. Pt agreeable at this time. Pt appears safe for discharge. [SB]    Clinical Course User Index [SB] Woodward Klem A, PA-C                           Medical Decision Making Amount and/or Complexity of Data Reviewed Labs: ordered.   Pt presents to the ED with palpitations ongoing x 10 months. She also noted an episode of bradycardia to the 40s PTA. However, she notes improvement of her heart rate when taking her midodrine. She hasn't taken her beta blocker today. Vital signs stable, pt afebrile, not hypoxic, or bradycardic. On exam, pt without acute cardiovascular, respiratory, or abdominal exam findings. Differential diagnosis includes ACS, PE, PNA, arrythmia.    Additional history obtained:  External records from outside source obtained and reviewed including:  Pt evaluated by Electrophysiologist on 06/20/21 who at that time recommended that the patient maintain follow up with her Cardiologist and he would see the patient as needed. At the time, Dr. Rayann Heman noted that the patient was deconditioned following her spinal surgery and that was the likely cause of her shortness of breath and palpitations.  Pt has a follow up appointment with her Cardiologist, Dr. Bettina Gavia on 12/26/21.  EKG:  Sinus  rhythm with non-specific anterolateral T waves  Labs:  I ordered, and personally interpreted labs.  The pertinent results include:  Initial troponin at 3, repeat troponin at <2.  CBC without leukocytosis or anemia.  CMP unremarkable.    Cardiac Monitoring: The patient was maintained on a cardiac monitor.  I personally viewed and interpreted the cardiac monitored which showed an underlying rhythm of: sinus rhythm with occasional nonsustained PVCs.    Consultations: I requested consultation with the Cardiologist, Dr. Marlou Porch,  and discussed lab and imaging findings as well as pertinent plan - they recommend: discharge home with cardiology follow up with Dr. Bettina Gavia  Disposition: Pt presentation suspicious for arrhythmia consistent of nonsustained PVCs as cause of palpitations. Doubt ACS at this time, pt with negative workup and without chest pain in the ED. Doubt PNA at this  time, pt without leukocytosis and afebrile in the ED. Doubt PE at this time, pt without tachycardia or hypoxia in the ED. After consideration of the diagnostic results and the patients response to treatment, I feel that the patient would benefit from discharge home with close cardiologist follow up. Attending evaluated patient at bedside and agrees with discharge home. Supportive care measures and strict return precautions discussed with patient at bedside. Pt acknowledges and verbalizes understanding. Pt appears safe for discharge. Follow up as indicated in discharge paperwork.    This chart was dictated using voice recognition software, Dragon. Despite the best efforts of this provider to proofread and correct errors, errors may still occur which can change documentation meaning.  Final Clinical Impression(s) / ED Diagnoses Final diagnoses:  None    Rx / DC Orders ED Discharge Orders     None         Jelesa Mangini A, PA-C 12/19/21 1812    Pattricia Boss, MD 12/21/21 1217

## 2021-12-19 NOTE — ED Notes (Signed)
Pt verbalized understanding of d/c instructions, meds and followup care. Denies questions. VSS, no distress noted. Steady gait to exit with all belongings.  ?

## 2021-12-20 ENCOUNTER — Ambulatory Visit: Payer: 59

## 2021-12-20 DIAGNOSIS — M545 Low back pain, unspecified: Secondary | ICD-10-CM

## 2021-12-20 DIAGNOSIS — M6281 Muscle weakness (generalized): Secondary | ICD-10-CM

## 2021-12-20 DIAGNOSIS — M546 Pain in thoracic spine: Secondary | ICD-10-CM

## 2021-12-20 DIAGNOSIS — R2689 Other abnormalities of gait and mobility: Secondary | ICD-10-CM

## 2021-12-20 DIAGNOSIS — G8929 Other chronic pain: Secondary | ICD-10-CM

## 2021-12-20 NOTE — Therapy (Addendum)
[name], [name], [address] Phone: [phone]   Fax:  [phone]  Physical Therapy Treatment  Patient Details  Name: [name] MRN: [id number] Date of Birth: [birth year] Referring Provider (PT): [name] ([name]).   Encounter Date: 12/20/2021   PT End of Session - 12/20/21 0847     Visit Number 33    Date for PT Re-Evaluation 02/14/22    Authorization Type [id number] visit limit through 10/19/22    Authorization - Visit Number 13    Authorization - Number of Visits 23    PT Start Time 0800    PT Stop Time 0846    PT Time Calculation (min) 46 min    Activity Tolerance Patient tolerated treatment well    Behavior During Therapy WFL for tasks assessed/performed             Past Medical History:  Diagnosis Date   Autonomic dysfunction    with sinus tachycardia and hypotension   Scoliosis     Past Surgical History:  Procedure Laterality Date   ABDOMINAL HYSTERECTOMY     BREAST LUMPECTOMY Bilateral    HERNIA REPAIR     SPINAL FUSION     TONSILLECTOMY      There were no vitals filed for this visit.   Subjective Assessment - 12/20/21 0804     Subjective I had to go to the ED yesterday.  I was having heart palpitations and was sweating.  Everything is good now and symptoms have resolved.    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 No/denies                [facility] PT Assessment - 12/20/21 0001       Assessment   Medical Diagnosis R29.898 (ICD-10-CM) -  Other symptoms and signs involving the musculoskeletal system    Referring Provider (PT) [name] ([name]).      Precautions   Precaution Comments spinal fusion T4-L4, [name]      Home Environment   Living Arrangements Spouse/significant other      Prior Function   Level of Independence Independent with basic ADLs;Other (comment);Independent with transfers    Vocation Full time employment      Strength   Overall Strength Comments bil LEs 4+/5, bil shoulders 4+/5 except ER is 4/5                           [facility] Adult PT Treatment/Exercise - 12/20/21 0001       Lumbar Exercises: Aerobic   Nustep L3 x 6' PT presnent to discuss status   shortness of breath at the end of this     Lumbar Exercises: Seated   Other Seated Lumbar Exercises sidebending over peanut ball x5      Shoulder Exercises: Seated   Other Seated Exercises 3 way raises: 1# x10 each- seated on green ball  Manual Therapy   Manual Therapy Soft tissue mobilization;Myofascial release    Manual therapy comments skilled palpation and monitoring during DN    Soft tissue mobilization bil gluteals and lumbar spine              Trigger Miranda Dry Needling - 12/20/21 0001     Muscles Treated Back/Hip Gluteus minimus;Gluteus medius;Piriformis;Quadratus lumborum   Rt only   Gluteus Minimus Response Twitch response elicited;Palpable increased muscle length    Gluteus Medius Response Twitch response elicited;Palpable increased muscle length    Piriformis Response Twitch response elicited;Palpable increased muscle length    Quadratus Lumborum Response Twitch response elicited;Palpable increased muscle length   Rt only                    PT Short Term Goals - 08/15/21 0815       PT SHORT TERM GOAL #1   Title Pt will learn self massage and assisted massage techniques (husband can help) for improved scar and  fascial mobility.    Status Achieved      PT SHORT TERM GOAL #2   Title Pt will be able to don/doff Lt shoe and sock with at least 50% greater ease due to improved Lt hip mobility.    Status Achieved      PT SHORT TERM GOAL #3   Title Pt will be able to participate in aquatic and land-based appointments with min SOB throughout session    Status Achieved      PT SHORT TERM GOAL #4   Title Reduce 5x sit to stand to </= 17 sec    Baseline 10 sec    Status Achieved               PT Long Term Goals - 12/20/21 0820       PT LONG TERM GOAL #1   Title Pt will be able to return to work for at least half-days most days of the week and report 50% improved endurance for this    Status Achieved      PT LONG TERM GOAL #2   Title Pt will feel strong enough to drive Enders distances (>30 minutes) to improve access to work, appointments and errands.    Status Achieved      PT LONG TERM GOAL #3   Title Pt will participate in 6' walk test covering at least 900' with min SOB and BORG RPE >/= 13 to demo improved endurance.    Baseline walking 1 hour 5x/week without dyspnea    Status Achieved      PT LONG TERM GOAL #4   Title Pt will be ind with advanced HEP and understand how to safely progress    Status Achieved      PT LONG TERM GOAL #5   Title report > or = to 50-60% overall improved endurance for daily tasks    Baseline 30%    Status Partially Met      PT LONG TERM GOAL #6   Title report 50-60% improved feeling of stiffness with daily tasks to improve comfort    Baseline 30%    Status Partially Met                   Plan - 12/20/21 0810     Clinical Impression Statement Pt was at the ED yesterday due to heart palpitations.  Pt continues to exercise as able at home and participate in aquatic PT. Pt reports  20-30% improved mobility since starting DN and 30% improved endurance overall since the start of care.   Session today focused on strength and mobility  exercises and dry needling and manual therapy to address chronic stiffness in the gluteals, lumbar and Rt quadratus. Pt with good response to DN with twitch response and improved tissue mobility at the end of the session.  Pt is going to join [employer] and perform aquatics and gym exercises on her own. Pt is now able to walk for exercise and has improved her endurance to work all day without taking a nap.  Chart will be left open until end of the plan of care in the event that pt requires further manual therapy to address chronic stiffness in muscles.    PT Treatment/Interventions Aquatic Therapy;ADLs/Self Care Home Management;Electrical Stimulation;Cryotherapy;Moist Heat;Neuromuscular re-education;Balance training;Therapeutic exercise;Therapeutic activities;Functional mobility training;Gait training;Patient/family education;Manual techniques;Dry needling;Scar mobilization;Passive range of motion;Taping;Energy conservation    PT Next Visit Plan Hold chart until 02/15/27    PT Home Exercise Plan Access Code: [email or web address]    Consulted and Agree with Plan of Care Patient             Patient will benefit from skilled therapeutic intervention in order to improve the following deficits and impairments:  Abnormal gait, Decreased range of motion, Increased fascial restricitons, Impaired tone, Postural dysfunction, Decreased strength, Decreased mobility, Impaired flexibility, Hypomobility, Decreased scar mobility, Pain, Decreased activity tolerance, Cardiopulmonary status limiting activity, Decreased endurance, Increased muscle spasms  Visit Diagnosis: Chronic bilateral low back pain without sciatica  Other abnormalities of gait and mobility  Pain in thoracic spine  Muscle weakness (generalized)     Problem List Patient Active Problem List   Diagnosis Date Noted   Scoliosis (and kyphoscoliosis), idiopathic 02/06/2012    Lorrene Reid, PT 12/20/21 8:56 AM  PHYSICAL THERAPY DISCHARGE  SUMMARY  Visits from Start of Care: 33  Current functional level related to goals / functional outcomes: See above for current PT status.    Remaining deficits: See above.  Endurance, strength and flexibility deficits.  Pt has HEP in place and is working out at [employer] and pool    Education / Equipment: HEP, energy management   Patient agrees to discharge. Patient goals were partially met. Patient is being discharged due to being pleased with the current functional level.  Lorrene Reid, PT 02/22/22 3:18 PM   [facility] @ [facility] [address] [place], [name], [address] Phone: [phone]   Fax:  [phone]  Name: [name] MRN: [id number] Date of Birth: 03-Dec-1970

## 2021-12-23 DIAGNOSIS — G909 Disorder of the autonomic nervous system, unspecified: Secondary | ICD-10-CM | POA: Insufficient documentation

## 2021-12-23 DIAGNOSIS — M419 Scoliosis, unspecified: Secondary | ICD-10-CM | POA: Insufficient documentation

## 2021-12-26 ENCOUNTER — Encounter: Payer: Self-pay | Admitting: Cardiology

## 2021-12-26 ENCOUNTER — Ambulatory Visit (INDEPENDENT_AMBULATORY_CARE_PROVIDER_SITE_OTHER): Payer: 59

## 2021-12-26 ENCOUNTER — Ambulatory Visit (INDEPENDENT_AMBULATORY_CARE_PROVIDER_SITE_OTHER): Payer: 59 | Admitting: Cardiology

## 2021-12-26 ENCOUNTER — Other Ambulatory Visit: Payer: Self-pay

## 2021-12-26 VITALS — BP 142/86 | HR 66 | Ht 66.0 in | Wt 130.8 lb

## 2021-12-26 DIAGNOSIS — I493 Ventricular premature depolarization: Secondary | ICD-10-CM

## 2021-12-26 DIAGNOSIS — I959 Hypotension, unspecified: Secondary | ICD-10-CM

## 2021-12-26 DIAGNOSIS — R000 Tachycardia, unspecified: Secondary | ICD-10-CM | POA: Diagnosis not present

## 2021-12-26 NOTE — Addendum Note (Signed)
Addended by: Edwyna Shell I on: 12/26/2021 08:36 AM   Modules accepted: Orders

## 2021-12-26 NOTE — Patient Instructions (Addendum)
Medication Instructions:  Your physician recommends that you continue on your current medications as directed. Please refer to the Current Medication list given to you today.  *If you need a refill on your cardiac medications before your next appointment, please call your pharmacy*   Lab Work: None If you have labs (blood work) drawn today and your tests are completely normal, you will receive your results only by: [facility] (if you have MyChart) OR A paper copy in the mail If you have any lab test that is abnormal or we need to change your treatment, we will call you to review the results.   Testing/Procedures: A zio monitor was ordered today. It will remain on for 14 days. You will then return monitor and event diary in provided box. It takes 1-2 weeks for report to be downloaded and returned to [place]. We will call you with the results. If monitor falls off or has orange flashing light, please call [employer] for further instructions.     Follow-Up: At [facility], you and your health needs are our priority.  As part of our continuing mission to provide you with exceptional heart care, we have created designated Provider Care Teams.  These Care Teams include your primary Cardiologist (physician) and Advanced Practice Providers (APPs -  Physician Assistants and Nurse Practitioners) who all work together to provide you with the care you need, when you need it.  We recommend signing up for the patient portal called "MyChart".  Sign up information is provided on this After Visit Summary.  MyChart is used to connect with patients for Virtual Visits (Telemedicine).  Patients are able to view lab/test results, encounter notes, upcoming appointments, etc.  Non-urgent messages can be sent to your provider as well.   To learn more about what you can do with MyChart, go to [email or web address].    Your next appointment:   2 months  The format for your next appointment:   In Person  Provider:    Shirlee More, MD    Other Instructions None

## 2021-12-26 NOTE — Progress Notes (Signed)
Cardiology Office Note:    Date:  12/26/2021   ID:  [name], DOB 03-Apr-1971, MRN [id number]  PCP:  Pcp, No  Cardiologist:  Shirlee More, MD   Referring MD: No ref. provider found  ASSESSMENT:    1. Sinus tachycardia   2. Frequent PVCs   3. Hypotension, unspecified hypotension type    PLAN:    In order of problems listed above:  Clearly has improved with physical therapy midodrine for blood pressure support up until recently good response to her beta-blocker as having a much higher frequency of PVCs had a heart rate at home at 40 pulse meter and had an episode of near syncope prompting the ED visit.  She took pictures of her EKG and monitor strips in the ED and she was having frequent PVCs unifocal trigeminy she continues to take over-the-counter magnesium takes no proarrhythmic drug.  I am concerned with her symptoms failure to respond to a beta-blocker we will apply a life monitor for 2 weeks patient may benefit from CPAP again and consideration of antiarrhythmic drug therapy to mitigate her symptoms. She will continue midodrine she has had a nice response along with physical therapy that is blunted illuminated her severe previous hypotension from innominate dysfunction or spine surgery  Next appointment6-8 weeks     Medication Adjustments/Labs and Tests Ordered: Current medicines are reviewed at length with the patient today.  Concerns regarding medicines are outlined above.  No orders of the defined types were placed in this encounter.  No orders of the defined types were placed in this encounter.    Chief Complaint  Patient presents with   Palpitations    Having more frequent and symptomatic PVCs and had an episode of near syncope prompting her ED visit    History of Present Illness:    [name] is a 51 y.o. female who is being seen today for the evaluation of autonomic dysfunction with sinus tachycardia and hypotension following extensive  spine surgery.  She has been seen by EP was not felt to have either inappropriate tachycardia or POTS syndrome.  She was last seen 10/12/2021 She was seen [facility] ED 12/19/2021  By EMS with palpitation In the ED heart rate 70 bpm blood pressure 171/96 EKG showed sinus rhythm nonspecific T wave Although she has no chest pain or indication of acute coronary syndrome shift to my sensitivity troponins checked both of which were within normal range she is maintained on monitor in the emergency room "she has sinus rhythm with occasional PVCs There is a phone conversation with my partner  Dr. Candee Furbish who recommended discharge from the hospital Past Medical History:  Diagnosis Date   Autonomic dysfunction    with sinus tachycardia and hypotension   Scoliosis     Past Surgical History:  Procedure Laterality Date   ABDOMINAL HYSTERECTOMY     BREAST LUMPECTOMY Bilateral    HERNIA REPAIR     SPINAL FUSION     TONSILLECTOMY      Current Medications: Current Meds  Medication Sig   acebutolol (SECTRAL) 200 MG capsule Take 1 capsule (200 mg total) by mouth 2 (two) times daily.   acetaminophen (TYLENOL) 650 MG CR tablet Take 1,300 mg by mouth every 8 (eight) hours as needed for pain.   ascorbic acid (VITAMIN C) 1000 MG tablet Take 1,000 mg by mouth daily.   B Complex Vitamins (VITAMIN B COMPLEX PO) Take 1 tablet by mouth daily.   CALCIUM  PO Take 1,000 mg by mouth daily.   Cholecalciferol (VITAMIN D3 PO) Take 1 tablet by mouth daily.   diazepam (VALIUM) 5 MG tablet Take 5 mg by mouth daily.   diphenhydrAMINE (SOMINEX) 25 MG tablet Take 25 mg by mouth at bedtime as needed for sleep.   FLUoxetine (PROZAC) 10 MG capsule Take 20 mg by mouth daily.   Ibuprofen 200 MG CAPS Take 2 capsules by mouth every 6 (six) hours as needed for pain.   magnesium oxide (MAG-OX) 400 MG tablet Take 400 mg by mouth daily.   midodrine (PROAMATINE) 10 MG tablet Take 1 tablet (10 mg total) by mouth 2 (two)  times daily.   Probiotic Product (PROBIOTIC DAILY PO) Take 1 capsule by mouth daily.     Allergies:   Ciprofloxacin, Fluoxetine, Robaxin [methocarbamol], Sertraline, Sertraline hcl, and Venlafaxine   Social History   Socioeconomic History   Marital status: Married    Spouse name: Not on file   Number of children: Not on file   Years of education: Not on file   Highest education level: Not on file  Occupational History   Not on file  Tobacco Use   Smoking status: Never    Passive exposure: Never   Smokeless tobacco: Never  Vaping Use   Vaping Use: Never used  Substance and Sexual Activity   Alcohol use: No   Drug use: No   Sexual activity: Not on file  Other Topics Concern   Not on file  Social History Narrative   Lives at home with husband   Left handed   Caffeine: none   Social Determinants of Health   Financial Resource Strain: Not on file  Food Insecurity: Not on file  Transportation Needs: Not on file  Physical Activity: Not on file  Stress: Not on file  Social Connections: Not on file     Family History: The patient's family history includes Autoimmune disease in her mother; Breast cancer in her paternal grandmother; Diabetes in her brother; Heart disease in her father, maternal grandfather, maternal grandmother, and paternal grandfather; Scoliosis in her mother; Suicidality in her brother.  ROS:   ROS Please see the history of present illness.     All other systems reviewed and are negative.  EKGs/Labs/Other Studies Reviewed:    The following studies were reviewed today:    She had a event monitor reported 06/16/2021 with frequent PVCs burden 7.6% with rare couplets triplets and 1 4 beat run of PVCs. Recent Labs: 10/12/2021: Magnesium 2.3; TSH 1.030 12/19/2021: ALT 14; BUN 17; Creatinine, Ser 0.75; Hemoglobin 12.7; Platelets 227; Potassium 4.1; Sodium 138  Recent Lipid Panel No results found for: CHOL, TRIG, HDL, CHOLHDL, VLDL, LDLCALC,  LDLDIRECT  Physical Exam:    VS:  BP (!) 142/86 (BP Location: Right Arm)    Pulse 66    Ht 5\' 6"  (1.676 m)    Wt 130 lb 12.8 oz (59.3 kg)    SpO2 98%    BMI 21.11 kg/m     Wt Readings from Last 3 Encounters:  12/26/21 130 lb 12.8 oz (59.3 kg)  12/19/21 130 lb (59 kg)  11/03/21 129 lb (58.5 kg)     GEN:  Well nourished, well developed in no acute distress HEENT: Normal NECK: No JVD; No carotid bruits LYMPHATICS: No lymphadenopathy CARDIAC: RRR, no murmurs, rubs, gallops RESPIRATORY:  Clear to auscultation without rales, wheezing or rhonchi  ABDOMEN: Soft, non-tender, non-distended MUSCULOSKELETAL:  No edema; No deformity  SKIN: Warm  and dry NEUROLOGIC:  Alert and oriented x 3 PSYCHIATRIC:  Normal affect     Signed, Shirlee More, MD  12/26/2021 8:15 AM    [place]

## 2022-01-02 ENCOUNTER — Institutional Professional Consult (permissible substitution): Payer: 59 | Admitting: Pulmonary Disease

## 2022-01-17 ENCOUNTER — Encounter: Payer: Self-pay | Admitting: Pulmonary Disease

## 2022-01-17 ENCOUNTER — Other Ambulatory Visit: Payer: Self-pay

## 2022-01-17 ENCOUNTER — Ambulatory Visit (INDEPENDENT_AMBULATORY_CARE_PROVIDER_SITE_OTHER): Payer: 59 | Admitting: Pulmonary Disease

## 2022-01-17 ENCOUNTER — Ambulatory Visit (INDEPENDENT_AMBULATORY_CARE_PROVIDER_SITE_OTHER): Payer: 59

## 2022-01-17 VITALS — BP 124/68 | HR 67 | Temp 98.4°F | Ht 66.0 in | Wt 132.6 lb

## 2022-01-17 DIAGNOSIS — R0602 Shortness of breath: Secondary | ICD-10-CM | POA: Diagnosis not present

## 2022-01-17 NOTE — Patient Instructions (Signed)
I have reviewed your images and history I agree that your symptoms may be coming from the autonomic nervous system issue To make sure we will get a chest x-ray and lung function test for evaluation of your lung Return to clinic in 3 months

## 2022-01-17 NOTE — Progress Notes (Signed)
Adeola Miranda    [id number]    [birth year]  Primary Care Physician:Pcp, No  Referring Physician: Richardo Priest, MD [address] [place],  [place] [address]  Chief complaint: Consult for dyspnea  HPI: 51 year old with severe scoliosis, atomic dysfunction Referred for evaluation of dyspnea She had T4-L4 lumbar fusion in March 2022 for treatment of scoliosis.  Ever since then she has had episodes of shortness of breath, dizziness, palpitations.  She sees Dr. Bettina Gavia and diagnosed with autonomic dysfunction and is being treated with acebutolol and midodrine with some improvement in symptoms.   Pets: Dog Occupation: Works as a Radio broadcast assistant Exposures: No mold, hot tub, Customer service manager.  No feather pillows or comforters Smoking history: Never smoker Travel history: No significant travel history Relevant family history: No family history of lung disease  Outpatient Encounter Medications as of 01/17/2022  Medication Sig   acebutolol (SECTRAL) 200 MG capsule Take 1 capsule (200 mg total) by mouth 2 (two) times daily.   acetaminophen (TYLENOL) 650 MG CR tablet Take 1,300 mg by mouth every 8 (eight) hours as needed for pain.   ascorbic acid (VITAMIN C) 1000 MG tablet Take 1,000 mg by mouth daily.   B Complex Vitamins (VITAMIN B COMPLEX PO) Take 1 tablet by mouth daily.   CALCIUM PO Take 1,000 mg by mouth daily.   Cholecalciferol (VITAMIN D3 PO) Take 1 tablet by mouth daily.   diazepam (VALIUM) 5 MG tablet Take 5 mg by mouth 2 (two) times daily as needed.   diphenhydrAMINE (SOMINEX) 25 MG tablet Take 25 mg by mouth at bedtime as needed for sleep.   FLUoxetine (PROZAC) 10 MG capsule Take 40 mg by mouth daily. Taking 40mg  daily   Ibuprofen 200 MG CAPS Take 2 capsules by mouth every 6 (six) hours as needed for pain.   magnesium oxide (MAG-OX) 400 MG tablet Take 400 mg by mouth daily.   midodrine (PROAMATINE) 10 MG tablet Take 1 tablet (10 mg total) by mouth 2 (two) times daily.    Probiotic Product (PROBIOTIC DAILY PO) Take 1 capsule by mouth daily.   No facility-administered encounter medications on file as of 01/17/2022.   Physical Exam: Blood pressure 124/68, pulse 67, temperature 98.4 F (36.9 C), temperature source Oral, height 5\' 6"  (1.676 m), weight 132 lb 9.6 oz (60.1 kg), SpO2 100 %. Gen:      No acute distress HEENT:  EOMI, sclera anicteric Neck:     No masses; no thyromegaly Lungs:    Clear to auscultation bilaterally; normal respiratory effort CV:         Regular rate and rhythm; no murmurs Abd:      + bowel sounds; soft, non-tender; no palpable masses, no distension Ext:    No edema; adequate peripheral perfusion Skin:      Warm and dry; no rash Neuro: alert and oriented x 3 Psych: normal mood and affect   Data Reviewed: Imaging: Chest x-ray 04/29/2021-no acute cardiopulmonary disease I have reviewed the images personally.  PFTs:  Labs:  Cardiac: Echocardiogram 06/08/2021-LVEF 32-20%, RV systolic size and function was normal.  Normal PA systolic pressure.  Assessment:  Assessment for dyspnea Her episodes of dyspnea are associated with dizziness and hypotension which are thought to be secondary to autonomic dysfunction.  Symptoms appear to be improving with treatment by Dr. Bettina Gavia She may have restrictive lung disease due to scoliosis though her spine curvature is improved after her lumbar fusion. Overall I do  not suspect any intrinsic lung issue We will get chest x-ray today and PFTs for evaluation of the lung  Plan/Recommendations: Chest x-ray, PFTs  Marshell Garfinkel MD [facility] 01/17/2022, 9:30 AM  CC: Richardo Priest, MD

## 2022-01-19 ENCOUNTER — Ambulatory Visit: Payer: 59 | Admitting: Cardiology

## 2022-01-23 ENCOUNTER — Encounter: Payer: Self-pay | Admitting: Pulmonary Disease

## 2022-01-23 DIAGNOSIS — R0602 Shortness of breath: Secondary | ICD-10-CM

## 2022-01-23 NOTE — Telephone Encounter (Signed)
Dr. Vaughan Browner, please see pts email. We could re-schedule pt first available PFT and follow up with you. Please advise on CT. Thanks!Marland Kitchen  ?

## 2022-01-24 NOTE — Telephone Encounter (Signed)
Yes.  Please see if we can move up the PFTs and order high-res CT to be done for evaluation of interstitial lung disease.  Thank you ?

## 2022-01-25 NOTE — Telephone Encounter (Signed)
Called and spoke with pt letting her know the info per Dr. Vaughan Browner and she verbalized understanding. PFT has been scheduled for March 23 and OV scheduled for pt April 3. Order has been placed for the PFT as well as the HRCT. Nothing further needed. ?

## 2022-02-09 ENCOUNTER — Ambulatory Visit (INDEPENDENT_AMBULATORY_CARE_PROVIDER_SITE_OTHER): Payer: 59 | Admitting: Pulmonary Disease

## 2022-02-09 ENCOUNTER — Other Ambulatory Visit: Payer: Self-pay

## 2022-02-09 DIAGNOSIS — R0602 Shortness of breath: Secondary | ICD-10-CM

## 2022-02-09 LAB — PULMONARY FUNCTION TEST
DL/VA % pred: 107 %
DL/VA: 4.55 ml/min/mmHg/L
DLCO cor % pred: 69 %
DLCO cor: 15.58 ml/min/mmHg
DLCO unc % pred: 69 %
DLCO unc: 15.58 ml/min/mmHg
FEF 25-75 Post: 3.14 L/sec
FEF 25-75 Pre: 2.31 L/sec
FEF2575-%Change-Post: 35 %
FEF2575-%Pred-Post: 110 %
FEF2575-%Pred-Pre: 81 %
FEV1-%Change-Post: 7 %
FEV1-%Pred-Post: 71 %
FEV1-%Pred-Pre: 66 %
FEV1-Post: 2.13 L
FEV1-Pre: 1.99 L
FEV1FVC-%Change-Post: 4 %
FEV1FVC-%Pred-Pre: 106 %
FEV6-%Change-Post: 1 %
FEV6-%Pred-Post: 64 %
FEV6-%Pred-Pre: 63 %
FEV6-Post: 2.37 L
FEV6-Pre: 2.33 L
FEV6FVC-%Pred-Post: 102 %
FEV6FVC-%Pred-Pre: 102 %
FVC-%Change-Post: 2 %
FVC-%Pred-Post: 63 %
FVC-%Pred-Pre: 61 %
FVC-Post: 2.38 L
FVC-Pre: 2.33 L
Post FEV1/FVC ratio: 89 %
Post FEV6/FVC ratio: 100 %
Pre FEV1/FVC ratio: 85 %
Pre FEV6/FVC Ratio: 100 %
RV % pred: 67 %
RV: 1.29 L
TLC % pred: 66 %
TLC: 3.58 L

## 2022-02-09 NOTE — Progress Notes (Signed)
Full PFT performed today. °

## 2022-02-09 NOTE — Patient Instructions (Signed)
Full PFT performed today. °

## 2022-02-15 ENCOUNTER — Other Ambulatory Visit: Payer: Self-pay

## 2022-02-15 ENCOUNTER — Ambulatory Visit (INDEPENDENT_AMBULATORY_CARE_PROVIDER_SITE_OTHER)
Admission: RE | Admit: 2022-02-15 | Discharge: 2022-02-15 | Disposition: A | Discharge status: Home / Self Care | Payer: 59 | Source: Ambulatory Visit | Attending: Pulmonary Disease | Admitting: Pulmonary Disease

## 2022-02-15 DIAGNOSIS — R0602 Shortness of breath: Secondary | ICD-10-CM | POA: Diagnosis not present

## 2022-02-20 ENCOUNTER — Encounter: Payer: Self-pay | Admitting: Pulmonary Disease

## 2022-02-20 ENCOUNTER — Ambulatory Visit (INDEPENDENT_AMBULATORY_CARE_PROVIDER_SITE_OTHER): Payer: 59 | Admitting: Pulmonary Disease

## 2022-02-20 VITALS — BP 120/68 | HR 69 | Temp 98.2°F | Ht 66.0 in | Wt 133.6 lb

## 2022-02-20 DIAGNOSIS — J984 Other disorders of lung: Secondary | ICD-10-CM | POA: Diagnosis not present

## 2022-02-20 DIAGNOSIS — M419 Scoliosis, unspecified: Secondary | ICD-10-CM | POA: Diagnosis not present

## 2022-02-20 DIAGNOSIS — R0602 Shortness of breath: Secondary | ICD-10-CM

## 2022-02-20 NOTE — Progress Notes (Signed)
? ?      ?[name]    [id number]    [birth year] ? ?Primary Care Physician:Pcp, No ? ?Referring Physician: No referring provider defined for this encounter. ? ?Chief complaint: Follow-up for dyspnea ? ?HPI: ?50 year old with severe scoliosis, atomic dysfunction ?Referred for evaluation of dyspnea ?She had T4-L4 lumbar fusion in March 2022 for treatment of scoliosis.  Ever since then she has had episodes of shortness of breath, dizziness, palpitations.  She sees Dr. Bettina Gavia and diagnosed with autonomic dysfunction and is being treated with acebutolol and midodrine with some improvement in symptoms. ? ?Pets: Dog ?Occupation: Works as a Radio broadcast assistant ?Exposures: No mold, hot tub, Customer service manager.  No feather pillows or comforters ?Smoking history: Never smoker ?Travel history: No significant travel history ?Relevant family history: No family history of lung disease ? ?Interim history: ?Here for review of CT and PFTs.  States that she continues to have issues with atomic dysfunction and intermittent dyspnea ? ?Outpatient Encounter Medications as of 02/20/2022  ?Medication Sig  ? acebutolol (SECTRAL) 200 MG capsule Take 1 capsule (200 mg total) by mouth 2 (two) times daily.  ? acetaminophen (TYLENOL) 650 MG CR tablet Take 1,300 mg by mouth every 8 (eight) hours as needed for pain.  ? ascorbic acid (VITAMIN C) 1000 MG tablet Take 1,000 mg by mouth daily.  ? B Complex Vitamins (VITAMIN B COMPLEX PO) Take 1 tablet by mouth daily.  ? CALCIUM PO Take 1,000 mg by mouth daily.  ? Cholecalciferol (VITAMIN D3 PO) Take 1 tablet by mouth daily.  ? diazepam (VALIUM) 5 MG tablet Take 5 mg by mouth 2 (two) times daily as needed.  ? diphenhydrAMINE (SOMINEX) 25 MG tablet Take 25 mg by mouth at bedtime as needed for sleep.  ? FLUoxetine (PROZAC) 10 MG capsule Take 40 mg by mouth daily. Taking '40mg'$  daily  ? Ibuprofen 200 MG CAPS Take 2 capsules by mouth every 6 (six) hours as needed for pain.  ? magnesium oxide (MAG-OX) 400 MG tablet  Take 400 mg by mouth daily.  ? midodrine (PROAMATINE) 10 MG tablet Take 1 tablet (10 mg total) by mouth 2 (two) times daily.  ? Probiotic Product (PROBIOTIC DAILY PO) Take 1 capsule by mouth daily.  ? ?No facility-administered encounter medications on file as of 02/20/2022.  ? ?Physical Exam: ?Blood pressure 120/68, pulse 69, temperature 98.2 ?F (36.8 ?C), temperature source Oral, height '5\' 6"'$  (1.676 m), weight 133 lb 9.6 oz (60.6 kg), SpO2 100 %. ?Gen:      No acute distress ?HEENT:  EOMI, sclera anicteric ?Neck:     No masses; no thyromegaly ?Lungs:    Clear to auscultation bilaterally; normal respiratory effort ?CV:         Regular rate and rhythm; no murmurs ?Abd:      + bowel sounds; soft, non-tender; no palpable masses, no distension ?Ext:    No edema; adequate peripheral perfusion ?Skin:      Warm and dry; no rash ?Neuro: alert and oriented x 3 ?Psych: normal mood and affect  ? ?Data Reviewed: ?Imaging: ?Chest x-ray 04/29/2021-no acute cardiopulmonary disease ? ?Chest x-ray 01/17/2022-diffuse coarsening of interstitial markings. ? ?CT high-resolution 02/15/2022-no fibrotic interstitial lung disease, scattered aortic atherosclerosis. ?I have reviewed the images personally. ? ?PFTs: ?02/09/2022 ?FVC 2.38 [63%], FEV1 2.13 [71%], F/F 89, TLC 3.58 [6 6%], DLCO 15.58 [69%] ?Moderate restriction and diffusion defect ? ?Labs: ? ?Cardiac: ?Echocardiogram 06/08/2021-LVEF 27-25%, RV systolic size and function was normal.  Normal PA systolic  pressure. ? ?Assessment:  ?Assessment for dyspnea ?Her episodes of dyspnea are associated with dizziness and hypotension which are thought to be secondary to autonomic dysfunction.  Symptoms appear to be improving with treatment by Dr. 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 ?No evidence of intrinsic lung abnormalities interstitial lung disease on high-res CT of chest ? ?I have reassured the patient  and she can follow-up in clinic as needed. ? ?Plan/Recommendations: ?Follow-up as needed ? ?Marshell Garfinkel MD ?[facility] ?02/20/2022, 4:10 PM ? ?CC: No ref. provider found ? ?  ?

## 2022-02-21 ENCOUNTER — Encounter: Payer: Self-pay | Admitting: Pulmonary Disease

## 2022-02-21 DIAGNOSIS — J984 Other disorders of lung: Secondary | ICD-10-CM | POA: Insufficient documentation

## 2022-03-01 ENCOUNTER — Ambulatory Visit (INDEPENDENT_AMBULATORY_CARE_PROVIDER_SITE_OTHER): Payer: 59 | Admitting: Cardiology

## 2022-03-01 ENCOUNTER — Encounter: Payer: Self-pay | Admitting: Cardiology

## 2022-03-01 VITALS — BP 102/64 | HR 72 | Ht 66.0 in | Wt 133.0 lb

## 2022-03-01 DIAGNOSIS — R000 Tachycardia, unspecified: Secondary | ICD-10-CM | POA: Diagnosis not present

## 2022-03-01 DIAGNOSIS — I959 Hypotension, unspecified: Secondary | ICD-10-CM | POA: Diagnosis not present

## 2022-03-01 DIAGNOSIS — I700 Atherosclerosis of aorta: Secondary | ICD-10-CM | POA: Diagnosis not present

## 2022-03-01 DIAGNOSIS — I493 Ventricular premature depolarization: Secondary | ICD-10-CM | POA: Diagnosis not present

## 2022-03-01 MED ORDER — ACEBUTOLOL HCL 200 MG PO CAPS
200.0000 mg | ORAL_CAPSULE | Freq: Two times a day (BID) | ORAL | 3 refills | Status: AC
Start: 2022-03-01 — End: ?

## 2022-03-01 NOTE — Progress Notes (Signed)
?[place]  ? ?Cardiology Office Note:   ? ?Date:  03/01/2022  ? ?ID:  [name], DOB [birth year], MRN [id number] ? ?PCP:  Saintclair Halsted, FNP  ?Cardiologist:  Shirlee More, MD   ? ?Referring MD: No ref. provider found  ? ? ?ASSESSMENT:   ? ?1. Hypotension, unspecified hypotension type   ?2. Sinus tachycardia   ?3. Frequent PVCs   ? ?PLAN:   ? ?In order of problems listed above: ? ?Overall [name] is doing much better with multiple specialist and rehabilitative programs and I think is recovering from autonomic dysfunction. ?She will reduce her midodrine on her own 50% and if tolerated she will discontinue ?After that she can reduce her beta-blocker to every second or third day before she stops ?She tells me she has aortic atherosclerosis on CT scan I will send a note to her PCP about having a lipoprotein a level checked with her father with CABG in his 66s when she is recovered fully recovered consider lipid-lowering therapy with a statin I would not start it now ? ? ?Next appointment: 6 months ? ? ?Medication Adjustments/Labs and Tests Ordered: ?Current medicines are reviewed at length with the patient today.  Concerns regarding medicines are outlined above.  ?No orders of the defined types were placed in this encounter. ? ?Meds ordered this encounter  ?Medications  ? acebutolol (SECTRAL) 200 MG capsule  ?  Sig: Take 1 capsule (200 mg total) by mouth 2 (two) times daily.  ?  Dispense:  180 capsule  ?  Refill:  3  ? ? ?Chief Complaint  ?Patient presents with  ? Follow-up  ?  She has had severe autonomic dysfunction with hypotension tachycardia and symptomatic PVCs since extensive spine surgery  ? ? ?History of Present Illness:   ? ?[name] is a 51 y.o. female with a hx of autonomic dysfunction with sinus tachycardia and hypertension following extensive spine surgery.  She was seen by EP and was not found to have a report of tachycardia or POTS syndrome.  She was  last seen by me 12/26/2021 the patient is doing better maintaining blood pressure with midodrine support and she was having symptomatic PVCs.   ?She utilized an event monitor for 14 days reported 01/21/2022 showed occasional ventricular ectopy burden 2.2% with rare couplets and symptomatic PVCs ?Marland Kitchen ?Compliance with diet, lifestyle and medications: Yes ? ?She is doing much better with multiple specialties involved including rehabilitative services psychiatry neurosurgery and cardiology ?She is engaged and physical therapy in the pool program and walks for mild at work. ?She still has abdominal muscle spasms her tremor is markedly improved she has reduced her beta-blocker and midodrine to once daily and she is going to start cutting back to see if she can withdraw and she is not having palpitation rapid heart rate or hypertension ?Past Medical History:  ?Diagnosis Date  ? Autonomic dysfunction   ? with sinus tachycardia and hypotension  ? Scoliosis   ? ? ?Past Surgical History:  ?Procedure Laterality Date  ? ABDOMINAL HYSTERECTOMY    ? BREAST LUMPECTOMY Bilateral   ? HERNIA REPAIR    ? SPINAL FUSION    ? TONSILLECTOMY    ? ? ?Current Medications: ?Current Meds  ?Medication Sig  ? acetaminophen (TYLENOL) 650 MG CR tablet Take 1,300 mg by mouth every 8 (eight) hours as needed for pain.  ? ascorbic acid (VITAMIN C) 1000 MG tablet Take 1,000 mg by mouth daily.  ?  B Complex Vitamins (VITAMIN B COMPLEX PO) Take 1 tablet by mouth daily.  ? CALCIUM PO Take 1,000 mg by mouth daily.  ? Cholecalciferol (VITAMIN D3 PO) Take 1 tablet by mouth daily.  ? diazepam (VALIUM) 5 MG tablet Take 5 mg by mouth 2 (two) times daily as needed.  ? diphenhydrAMINE (SOMINEX) 25 MG tablet Take 25 mg by mouth at bedtime as needed for sleep.  ? FLUoxetine (PROZAC) 10 MG capsule Take 40 mg by mouth daily. Taking '40mg'$  daily  ? Ibuprofen 200 MG CAPS Take 2 capsules by mouth every 6 (six) hours as needed for pain.  ? magnesium oxide (MAG-OX) 400 MG  tablet Take 400 mg by mouth daily.  ? midodrine (PROAMATINE) 10 MG tablet Take 1 tablet (10 mg total) by mouth 2 (two) times daily.  ? Probiotic Product (PROBIOTIC DAILY PO) Take 1 capsule by mouth daily.  ? [DISCONTINUED] acebutolol (SECTRAL) 200 MG capsule Take 1 capsule (200 mg total) by mouth 2 (two) times daily.  ?  ? ?Allergies:   Ciprofloxacin, Robaxin [methocarbamol], Sertraline, Sertraline hcl, and Venlafaxine  ? ?Social History  ? ?Socioeconomic History  ? Marital status: Married  ?  Spouse name: Not on file  ? Number of children: Not on file  ? Years of education: Not on file  ? Highest education level: Not on file  ?Occupational History  ? Not on file  ?Tobacco Use  ? Smoking status: Never  ?  Passive exposure: Never  ? Smokeless tobacco: Never  ?Vaping Use  ? Vaping Use: Never used  ?Substance and Sexual Activity  ? Alcohol use: No  ? Drug use: No  ? Sexual activity: Not on file  ?Other Topics Concern  ? Not on file  ?Social History Narrative  ? Lives at home with husband  ? Left handed  ? Caffeine: none  ? ?Social Determinants of Health  ? ?Financial Resource Strain: Not on file  ?Food Insecurity: Not on file  ?Transportation Needs: Not on file  ?Physical Activity: Not on file  ?Stress: Not on file  ?Social Connections: Not on file  ?  ? ?Family History: ?The patient's  ?family history includes Autoimmune disease in her mother; Breast cancer in her paternal grandmother; Diabetes in her brother; Heart disease in her father, maternal grandfather, maternal grandmother, and paternal grandfather; Scoliosis in her mother; Suicidality in her brother. ?ROS:   ?Please see the history of present illness.    ?All other systems reviewed and are negative. ? ?EKGs/Labs/Other Studies Reviewed:   ? ?The following studies were reviewed today: ? ? ? ?Recent Labs: ?10/12/2021: Magnesium 2.3; TSH 1.030 ?12/19/2021: ALT 14; BUN 17; Creatinine, Ser 0.75; Hemoglobin 12.7; Platelets 227; Potassium 4.1; Sodium 138  ?Recent  Lipid Panel ?No results found for: CHOL, TRIG, HDL, CHOLHDL, VLDL, LDLCALC, LDLDIRECT ? ?Physical Exam:   ? ?VS:  BP 102/64 (BP Location: Left Arm)   Pulse 72   Ht '5\' 6"'$  (1.676 m)   Wt 133 lb (60.3 kg)   SpO2 98%   BMI 21.47 kg/m?    ? ?Wt Readings from Last 3 Encounters:  ?03/01/22 133 lb (60.3 kg)  ?02/20/22 133 lb 9.6 oz (60.6 kg)  ?01/17/22 132 lb 9.6 oz (60.1 kg)  ?  ? ?GEN: She looks much healthier no longer has a resting tremor well nourished, well developed in no acute distress ?HEENT: Normal ?NECK: No JVD; No carotid bruits ?LYMPHATICS: No lymphadenopathy ?CARDIAC: RRR, no murmurs, rubs, gallops ?RESPIRATORY:  Clear  to auscultation without rales, wheezing or rhonchi  ?ABDOMEN: Soft, non-tender, non-distended ?MUSCULOSKELETAL:  No edema; No deformity  ?SKIN: Warm and dry ?NEUROLOGIC:  Alert and oriented x 3 ?PSYCHIATRIC:  Normal affect  ? ? ?Signed, ?Shirlee More, MD  ?03/01/2022 3:09 PM    ?[place]  ?

## 2022-03-01 NOTE — Patient Instructions (Signed)
Medication Instructions:  ?Your physician recommends that you continue on your current medications as directed. Please refer to the Current Medication list given to you today. ? ?*If you need a refill on your cardiac medications before your next appointment, please call your pharmacy* ? ? ?Lab Work: ?None ?If you have labs (blood work) drawn today and your tests are completely normal, you will receive your results only by: ?MyChart Message (if you have MyChart) OR ?A paper copy in the mail ?If you have any lab test that is abnormal or we need to change your treatment, we will call you to review the results. ? ? ?Testing/Procedures: ?None ? ? ?Follow-Up: ?At [facility], you and your health needs are our priority.  As part of our continuing mission to provide you with exceptional heart care, we have created designated Provider Care Teams.  These Care Teams include your primary Cardiologist (physician) and Advanced Practice Providers (APPs -  Physician Assistants and Nurse Practitioners) who all work together to provide you with the care you need, when you need it. ? ?We recommend signing up for the patient portal called "MyChart".  Sign up information is provided on this After Visit Summary.  MyChart is used to connect with patients for Virtual Visits (Telemedicine).  Patients are able to view lab/test results, encounter notes, upcoming appointments, etc.  Non-urgent messages can be sent to your provider as well.   ?To learn more about what you can do with MyChart, go to [email or web address].   ? ?Your next appointment:   ?6 month(s) ? ?The format for your next appointment:   ?In Person ? ?Provider:   ?Shirlee More, MD  ? ? ?Other Instructions ?None ? ?Important Information About Sugar ? ? ? ? ? ? ? ? ? ? ?1. Avoid all over-the-counter antihistamines except Claritin/Loratadine and Zyrtec/Cetrizine. ?2. Avoid all combination including cold sinus allergies flu decongestant and sleep medications ?3. You can use  Robitussin DM Mucinex and Mucinex DM for cough. ?4. can use Tylenol aspirin ibuprofen and naproxen but no combinations such as sleep or sinus. ? ?

## 2022-04-07 ENCOUNTER — Ambulatory Visit: Payer: 59 | Admitting: Pulmonary Disease

## 2022-07-10 ENCOUNTER — Other Ambulatory Visit: Payer: Self-pay | Admitting: Neurological Surgery

## 2022-07-10 DIAGNOSIS — M419 Scoliosis, unspecified: Secondary | ICD-10-CM

## 2022-07-26 ENCOUNTER — Ambulatory Visit
Admission: RE | Admit: 2022-07-26 | Discharge: 2022-07-26 | Disposition: A | Discharge status: Home / Self Care | Payer: 59 | Source: Ambulatory Visit | Attending: Neurological Surgery | Admitting: Neurological Surgery

## 2022-07-26 DIAGNOSIS — M419 Scoliosis, unspecified: Secondary | ICD-10-CM

## 2022-10-25 ENCOUNTER — Ambulatory Visit: Payer: 59 | Admitting: Cardiology

## 2022-12-04 ENCOUNTER — Ambulatory Visit: Payer: 59 | Admitting: Cardiology

## 2023-03-19 ENCOUNTER — Other Ambulatory Visit: Payer: Self-pay | Admitting: Specialist

## 2023-03-19 DIAGNOSIS — M5416 Radiculopathy, lumbar region: Secondary | ICD-10-CM

## 2023-04-03 ENCOUNTER — Other Ambulatory Visit: Payer: Self-pay | Admitting: Specialist

## 2023-04-03 DIAGNOSIS — M5416 Radiculopathy, lumbar region: Secondary | ICD-10-CM

## 2023-04-07 ENCOUNTER — Ambulatory Visit
Admission: RE | Admit: 2023-04-07 | Discharge: 2023-04-07 | Discharge status: Home / Self Care | Payer: 59 | Source: Ambulatory Visit | Attending: Specialist | Admitting: Specialist

## 2023-04-07 DIAGNOSIS — M5416 Radiculopathy, lumbar region: Secondary | ICD-10-CM

## 2023-10-01 ENCOUNTER — Other Ambulatory Visit: Payer: Self-pay | Admitting: Family Medicine

## 2023-10-01 DIAGNOSIS — M5459 Other low back pain: Secondary | ICD-10-CM

## 2023-10-01 DIAGNOSIS — R8290 Unspecified abnormal findings in urine: Secondary | ICD-10-CM

## 2023-10-08 ENCOUNTER — Ambulatory Visit
Admission: RE | Admit: 2023-10-08 | Discharge: 2023-10-08 | Disposition: A | Discharge status: Home / Self Care | Payer: 59 | Source: Ambulatory Visit | Attending: Family Medicine | Admitting: Family Medicine

## 2023-10-08 DIAGNOSIS — R8290 Unspecified abnormal findings in urine: Secondary | ICD-10-CM

## 2023-10-08 DIAGNOSIS — M5459 Other low back pain: Secondary | ICD-10-CM

## 2024-10-01 ENCOUNTER — Other Ambulatory Visit: Payer: Self-pay | Admitting: Family Medicine

## 2024-10-01 DIAGNOSIS — Z1231 Encounter for screening mammogram for malignant neoplasm of breast: Secondary | ICD-10-CM

## 2024-10-10 ENCOUNTER — Other Ambulatory Visit (HOSPITAL_COMMUNITY): Payer: Self-pay | Admitting: *Deleted

## 2024-10-10 DIAGNOSIS — E785 Hyperlipidemia, unspecified: Secondary | ICD-10-CM

## 2024-10-12 ENCOUNTER — Other Ambulatory Visit (HOSPITAL_BASED_OUTPATIENT_CLINIC_OR_DEPARTMENT_OTHER): Payer: Self-pay | Admitting: Family Medicine

## 2024-10-12 DIAGNOSIS — E2839 Other primary ovarian failure: Secondary | ICD-10-CM

## 2024-10-20 ENCOUNTER — Ambulatory Visit (HOSPITAL_BASED_OUTPATIENT_CLINIC_OR_DEPARTMENT_OTHER)
Admission: RE | Admit: 2024-10-20 | Discharge: 2024-10-20 | Disposition: A | Discharge status: Home / Self Care | Payer: Self-pay | Source: Ambulatory Visit | Attending: Family Medicine | Admitting: Family Medicine

## 2024-10-20 DIAGNOSIS — E785 Hyperlipidemia, unspecified: Secondary | ICD-10-CM | POA: Insufficient documentation

## 2024-10-24 ENCOUNTER — Ambulatory Visit
Admission: RE | Admit: 2024-10-24 | Discharge: 2024-10-24 | Disposition: A | Source: Ambulatory Visit | Attending: Family Medicine | Admitting: Family Medicine

## 2024-10-24 DIAGNOSIS — Z1231 Encounter for screening mammogram for malignant neoplasm of breast: Secondary | ICD-10-CM

## 2025-01-21 ENCOUNTER — Ambulatory Visit: Admitting: Cardiology
